# Patient Record
Sex: Male | Born: 1950 | Race: White | Hispanic: No | Marital: Married | Smoking: Never smoker
Health system: Southern US, Community
[De-identification: ages and names within clinical notes are randomized; demographics above are authoritative.]

## PROBLEM LIST (undated history)

## (undated) ENCOUNTER — Ambulatory Visit: Source: Home / Self Care

## (undated) DIAGNOSIS — F329 Major depressive disorder, single episode, unspecified: Secondary | ICD-10-CM

## (undated) DIAGNOSIS — S83249A Other tear of medial meniscus, current injury, unspecified knee, initial encounter: Secondary | ICD-10-CM

## (undated) DIAGNOSIS — F32A Depression, unspecified: Secondary | ICD-10-CM

## (undated) DIAGNOSIS — I10 Essential (primary) hypertension: Secondary | ICD-10-CM

## (undated) HISTORY — PX: EYE SURGERY: SHX253

## (undated) HISTORY — PX: HAND SURGERY: SHX662

---
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
## 1999-09-07 ENCOUNTER — Encounter: Payer: Self-pay | Admitting: Family Medicine

## 1999-09-07 ENCOUNTER — Ambulatory Visit (HOSPITAL_COMMUNITY): Admission: RE | Admit: 1999-09-07 | Discharge: 1999-09-07 | Payer: Self-pay | Admitting: Family Medicine

## 2007-06-13 ENCOUNTER — Inpatient Hospital Stay (HOSPITAL_COMMUNITY): Admission: EM | Admit: 2007-06-13 | Discharge: 2007-06-18 | Payer: Self-pay | Admitting: Emergency Medicine

## 2007-06-18 ENCOUNTER — Ambulatory Visit: Payer: Self-pay | Admitting: Physical Medicine & Rehabilitation

## 2007-12-10 ENCOUNTER — Ambulatory Visit (HOSPITAL_BASED_OUTPATIENT_CLINIC_OR_DEPARTMENT_OTHER): Admission: RE | Admit: 2007-12-10 | Discharge: 2007-12-11 | Payer: Self-pay | Admitting: *Deleted

## 2008-03-03 ENCOUNTER — Ambulatory Visit (HOSPITAL_BASED_OUTPATIENT_CLINIC_OR_DEPARTMENT_OTHER): Admission: RE | Admit: 2008-03-03 | Discharge: 2008-03-03 | Payer: Self-pay | Admitting: *Deleted

## 2009-05-06 ENCOUNTER — Emergency Department (HOSPITAL_COMMUNITY): Admission: EM | Admit: 2009-05-06 | Discharge: 2009-05-06 | Payer: Self-pay | Admitting: Family Medicine

## 2011-01-09 NOTE — H&P (Signed)
NAME:  Dwayne Cross, Dwayne Cross                ACCOUNT NO.:  [id number]   MEDICAL RECORD NO.:  [id number]          PATIENT TYPE:  INP   LOCATION:  1422                         FACILITY:  [facility]   PHYSICIAN:  Burnard Bunting, M.D.    DATE OF BIRTH:  11-21-1950   DATE OF ADMISSION:  06/12/2007  DATE OF DISCHARGE:                              HISTORY & PHYSICAL   CHIEF COMPLAINT:  Right hip pain.   HISTORY OF PRESENT ILLNESS:  The patient is a 60 year old patient with  right hip pain.  He is [employer] who fell at work  hard onto his right hip.  He has been unable to bear weight since that  time.  There was no loss of consciousness.  He denies any other  orthopedic complaints.  He denies any numbness or tingling in his legs.  He does report pain with any attempted movement of the right leg.   PAST MEDICAL HISTORY:  Notable for hypertension and depression.   PAST SURGICAL HISTORY:  Notable for eye surgery as a child.   MEDICATIONS:  1. Avalide.  2. Wellbutrin.   ALLERGIES:  No known drug allergies.   SOCIAL HISTORY:  The patient is married.  He does not smoke.  No alcohol  use.  He does work at [employer].   REVIEW OF SYSTEMS:  14 systems reviewed are noncontributory.  Specifically there is no history of DVT in the family, but his mother  does have osteoporosis.  Denies any other fever, chills, chest pain, or  shortness of breath.  There is also no diabetes.   PHYSICAL EXAMINATION:  VITAL SIGNS:  Blood pressure 143/93, pulse 76,  respirations 20.  CHEST:  Clear to auscultation.  HEART:  Regular rate and rhythm.  ABDOMEN:  Benign.  NECK:  Cervical spine has full range of motion.  EXTREMITIES:  He has intact bilateral upper extremity motion in wrists,  shoulders, and elbows with palpable radial pulse, intact sensation, and  no other masses, lymphadenopathy, or skin changes noted in the upper  extremity region.  Lower extremity demonstrates some pain with internal  and external rotation of right leg.  He has no grinding or crepitus in  the right knee or ankle with range of motion.  There is no effusion in  the knees.  He has good dorsiflexion, plantar flexion, strength, intact  sensation on the dorsoplantar aspect of the foot.  He has good quad and  hamstring strength as can best be assessed with the amount of pain that  he is in on his right hip.  Reflexes generally symmetric, no Morale  Leveaux lesion on the right hip area with no bruising or ecchymosis.   Radiographs demonstrated T-type acetabular fracture seen in both plain  radiographs and CT scan.   IMPRESSION:  T-type acetabular fracture with minimal displacement.   PLAN:  This may be an operative or nonoperative problem.  For now I am  going to admit him, put him on some foot pumps, low dose Coumadin.  I  will have one of my partners evaluate him tonight  for possible operative  intervention.      Burnard Bunting, M.D.  Electronically Signed     GSD/MEDQ  D:  06/13/2007  T:  06/13/2007  Job:  [id number]

## 2011-01-09 NOTE — Discharge Summary (Signed)
NAME:  Dwayne Cross, Dwayne Cross                ACCOUNT NO.:  [id number]   MEDICAL RECORD NO.:  [id number]          PATIENT TYPE:  INP   LOCATION:  1422                         FACILITY:  [facility]   PHYSICIAN:  Nadara Mustard, MD     DATE OF BIRTH:  September 02, 1950   DATE OF ADMISSION:  06/12/2007  DATE OF DISCHARGE:  06/18/2007                               DISCHARGE SUMMARY   DIAGNOSIS:  Right anterior wall acetabular fracture.   Discharged to skilled nursing in stable condition.   FOLLOW UP:  Dr. Lajoyce Corners in two weeks.   DISCHARGE MEDICATIONS:  Include his admission medications, which are:  1. Avelide 12.5/150 1/2 tablet daily.  2. Wellbutrin 300 mg 1 p.o. daily.  His p.r.n. medications, which include:  1. Tylox p.o. q.4-6h. p.r.n. pain.  2. Coumadin 1 mg 1 p.o. daily x1 month.  3. Restoril 30 mg 1 p.o. nightly p.r.n. sleep.   Physical therapy, progressive ambulation, nonweightbearing on the right.   Follow up with Dr. Lajoyce Corners in two weeks.   HISTORY OF PRESENT ILLNESS:  Patient is a 60 year old gentleman who fell  while working at [employer].  He fell directly on his right  hip.  Admission radiograph shows a nondisplaced anterior wall fracture  of the right hip.  Patient was started on ambulation.  Repeat  radiographs showed no further displacement of the anterior wall  fracture.  This was out of the weightbearing dome of the acetabulum and  was felt to be stable.  Patient was able to independently ambulate  without weightbearing.   At the time of discharge, the patient's hospital course was essentially  unremarkable.  He was seen by physical therapy and occupational therapy  and was able to independently  ambulate.  Patient was not felt to be  rehab candidate per physical therapy.  Patient requested skilled nursing  versus discharge to home, and a social worker and his insurance company  were working on discharge to skilled nursing.  Patient will require  occupational therapy as well as  physical therapy, strict  nonweightbearing on the right.  Follow up with Dr. Lajoyce Corners in two weeks.      Nadara Mustard, MD  Electronically Signed     MVD/MEDQ  D:  06/18/2007  T:  06/18/2007  Job:  [id number]

## 2011-01-09 NOTE — Op Note (Signed)
NAME:  Dwayne Cross, Dwayne Cross                ACCOUNT NO.:  [id number]   MEDICAL RECORD NO.:  [id number]          PATIENT TYPE:  AMB   LOCATION:  DSC                          FACILITY:  [facility]   PHYSICIAN:  Tennis Must Meyerdierks, M.D.DATE OF BIRTH:  [birth year]   DATE OF PROCEDURE:  03/03/2008  DATE OF DISCHARGE:                               OPERATIVE REPORT   PREOPERATIVE DIAGNOSIS:  Status post open reduction and internal  fixation with bone grafting, right scaphoid.   POSTOPERATIVE DIAGNOSIS:  Status post open reduction and internal  fixation with bone grafting, right scaphoid.   PROCEDURE:  Removal of pins, right scaphoid.   SURGEON:  Lowell Bouton, MD   ANESTHESIA:  General.   OPERATIVE FINDINGS:  The patient had three pins, one of which had come  out with the prep, as it was partially poking through the skin.  The  remaining two pins were much deeper.   PROCEDURE:  The scaphoid appeared to be healed under fluoroscopy.   PROCEDURE:  Under general anesthesia with a tourniquet on the right arm,  the right hand was prepped and draped in usual fashion.  After elevating  the limb, the tourniquet was inflated to 250 mmHg.  The pins were  localized under fluoroscopy and 1-cm incision was made over the thenar  eminence.  Blunt dissection was carried down through the subcutaneous  tissues and the pins were identified and removed with a needle holder.  X-rays showed that the fracture was healed under fluoroscopy.  The wound  was irrigated and closed with 4-0 nylon sutures.  Marcaine 0.5% was  placed in the skin edges for pain control.  Sterile dressings were  applied followed by a volar wrist splint.  The patient tolerated the  procedure well and went to recovery room awake and stable in good  condition.      Lowell Bouton, M.D.  Electronically Signed     EMM/MEDQ  D:  03/03/2008  T:  03/04/2008  Job:  [id number]

## 2011-01-09 NOTE — Op Note (Signed)
NAME:  Dwayne Cross, Dwayne Cross                ACCOUNT NO.:  [id number]   MEDICAL RECORD NO.:  [id number]          PATIENT TYPE:  AMB   LOCATION:  DSC                          FACILITY:  [facility]   PHYSICIAN:  Tennis Must Meyerdierks, M.D.DATE OF BIRTH:  10-Aug-1951   DATE OF PROCEDURE:  12/10/2007  DATE OF DISCHARGE:                               OPERATIVE REPORT   PREOPERATIVE DIAGNOSIS:  Scaphoid nonunion, right wrist.   POSTOPERATIVE DIAGNOSIS:  Scaphoid nonunion, right wrist.   PROCEDURE:  Open reduction internal fixation of right scaphoid, with  right iliac crest bone grafting.   SURGEON:  Lowell Bouton, M.D.   ANESTHESIA:  General.   OPERATIVE FINDINGS:  The patient had a scaphoid nonunion in the  midportion of the bone.  There was a lot of scar tissue between the 2  ends of the scaphoid.  There was punctate bleeding on both sides after  scraping out all the scar tissue.   PROCEDURE:  Under general anesthesia with a tourniquet on the right arm,  the right arm and right hip were prepped and draped in usual fashion,  and after exsanguinating the limb, the tourniquet was inflated to 250  mmHg.  A volar approach was made in line with the FCR tendon  longitudinally and carried obliquely across the thenar eminence.  A  sharp dissection was carried through the subcutaneous tissues and  bleeding points were coagulated.  Blunt dissection was carried down to  the radial artery, which traversed the field.  It was bluntly dissected  out and gently retracted with a vessel loop.  The FCR sheath was then  incised longitudinally and the tendon was retracted.  The floor of the  sheath was then incised longitudinally down to the joint.  The fracture  site was easily identifiable.  A Freer elevator was used to elevate the  radioscaphoid joint which appeared fairly smooth.  A 4-5 K-wire was  placed in the distal pole of the scaphoid and 1 in the proximal poles,  used as joysticks and the  fracture site was debrided with a rongeur and  curettes.  After completely debriding all the scar tissue, the gap was  about 1-cm.  A right iliac crest bone graft was then obtained through an  oblique incision over the top of the crest.  Dissection was carried  through the subcutaneous tissues with electrocautery.  Blunt dissection  was carried through the subcutaneous fat down to the top of the iliac  crest.  The periosteum was then incised with electrocautery and Key  elevator was used to elevate the muscle away from the crest.  A straight  osteotome was then used to take a wedge of bone from the top of the  iliac crest, that measured about a centimeter wide.  Curved osteotomes  were used to remove the deep part of the graft.  The graft was then  placed in a basin and a curette was used to take medullary graft.  The  hip wound was then packed open.  The graft was then tailored to the  defect with a rongeur and  placed in the gap between the 2 ends of the  scaphoid.  The joysticks assisted with correcting the DC deformity of  the lunate.  One joystick was removed from the proximal scaphoid and  replaced into the lunate bone.  A K-wire was then used to drill the ends  of the scaphoid so that there was good bleeding, and the medullary graft  was packed into the defects in the scaphoid nonunion.  The cortical  cancellous graft was then inserted and was tamped into place.  Three  .045 K-wires were then passed from distal to proximal across the graft  and into the proximal scaphoid.  The pins were cut below the skin at the  scaphoid tuberosity.  X-rays showed good position of the graft and under  fluoroscopy there was no impingement.  The graft was contoured with a  rongeur to make sure there was no impingement radially.  The graft  appeared to be stable.  There appeared to be excellent position of the  pins.  The iliac crest site was then irrigated copiously with saline.  Bone wax was used to  control the bleeding.  The fascia was closed with 2-  0 chromic suture and subcutaneous tissue was closed with 4-0 Vicryl.  The skin was closed with a 3-0 subcuticular Prolene.  Steri-Strips were  applied and 0.5% Marcaine was placed in the skin edges for the pain  control.  Sterile dressings were applied.  The wrist wound was closed  using 4-0 Vicryl in the capsular tissue deep and then 4-0 Vicryl in the  subcutaneous tissues with a 3-0 subcuticular Prolene in the skin.  Steri-  Strips were applied and 0.5% Marcaine was inserted for pain control.  The patient was then placed in a thumb spica splint.  He tolerated the  procedure well and went to the recovery room awake in stable and good  condition.      Lowell Bouton, M.D.  Electronically Signed     EMM/MEDQ  D:  12/10/2007  T:  12/11/2007  Job:  [id number]

## 2011-05-22 LAB — POCT I-STAT, CHEM 8
BUN: 26 — ABNORMAL HIGH
Calcium, Ion: 1.17
Chloride: 102
Creatinine, Ser: 1.2
Glucose, Bld: 82
Hemoglobin: 15.3
TCO2: 29

## 2011-05-24 LAB — POCT HEMOGLOBIN-HEMACUE: Hemoglobin: 15.7

## 2011-05-24 LAB — BASIC METABOLIC PANEL
Chloride: 98
GFR calc Af Amer: >60
Potassium: 3.5

## 2011-06-06 LAB — PROTIME-INR
INR: 1
Prothrombin Time: 13.7
Prothrombin Time: 13.8
Prothrombin Time: 14.6
Prothrombin Time: 15.9 — ABNORMAL HIGH

## 2018-09-15 DIAGNOSIS — L301 Dyshidrosis [pompholyx]: Secondary | ICD-10-CM | POA: Diagnosis not present

## 2018-09-15 DIAGNOSIS — I10 Essential (primary) hypertension: Secondary | ICD-10-CM | POA: Diagnosis not present

## 2018-09-15 DIAGNOSIS — Z23 Encounter for immunization: Secondary | ICD-10-CM | POA: Diagnosis not present

## 2018-09-15 DIAGNOSIS — F330 Major depressive disorder, recurrent, mild: Secondary | ICD-10-CM | POA: Diagnosis not present

## 2018-09-15 DIAGNOSIS — Z8042 Family history of malignant neoplasm of prostate: Secondary | ICD-10-CM | POA: Diagnosis not present

## 2018-09-15 DIAGNOSIS — N529 Male erectile dysfunction, unspecified: Secondary | ICD-10-CM | POA: Diagnosis not present

## 2018-09-15 DIAGNOSIS — Z0000 Encounter for general adult medical examination without abnormal findings: Secondary | ICD-10-CM | POA: Diagnosis not present

## 2018-09-15 DIAGNOSIS — Z125 Encounter for screening for malignant neoplasm of prostate: Secondary | ICD-10-CM | POA: Diagnosis not present

## 2018-09-15 DIAGNOSIS — J3081 Allergic rhinitis due to animal (cat) (dog) hair and dander: Secondary | ICD-10-CM | POA: Diagnosis not present

## 2018-09-15 DIAGNOSIS — J309 Allergic rhinitis, unspecified: Secondary | ICD-10-CM | POA: Diagnosis not present

## 2019-05-22 DIAGNOSIS — M25562 Pain in left knee: Secondary | ICD-10-CM | POA: Diagnosis not present

## 2019-05-25 ENCOUNTER — Other Ambulatory Visit: Payer: Self-pay | Admitting: Family Medicine

## 2019-05-25 DIAGNOSIS — M25562 Pain in left knee: Secondary | ICD-10-CM

## 2019-05-26 ENCOUNTER — Other Ambulatory Visit: Payer: Self-pay

## 2019-05-26 ENCOUNTER — Ambulatory Visit
Admission: RE | Admit: 2019-05-26 | Discharge: 2019-05-26 | Disposition: A | Discharge status: Home / Self Care | Payer: Medicare Other | Source: Ambulatory Visit | Attending: Family Medicine | Admitting: Family Medicine

## 2019-05-26 DIAGNOSIS — M7122 Synovial cyst of popliteal space [Baker], left knee: Secondary | ICD-10-CM | POA: Diagnosis not present

## 2019-05-26 DIAGNOSIS — S82232A Displaced oblique fracture of shaft of left tibia, initial encounter for closed fracture: Secondary | ICD-10-CM | POA: Diagnosis not present

## 2019-05-26 DIAGNOSIS — M25462 Effusion, left knee: Secondary | ICD-10-CM | POA: Diagnosis not present

## 2019-05-26 DIAGNOSIS — M25562 Pain in left knee: Secondary | ICD-10-CM

## 2019-05-28 ENCOUNTER — Encounter: Payer: Self-pay | Admitting: Orthopedic Surgery

## 2019-05-28 ENCOUNTER — Ambulatory Visit (INDEPENDENT_AMBULATORY_CARE_PROVIDER_SITE_OTHER): Payer: Medicare Other | Admitting: Orthopedic Surgery

## 2019-05-28 VITALS — Ht 69.0 in | Wt 193.0 lb

## 2019-05-28 DIAGNOSIS — S83242A Other tear of medial meniscus, current injury, left knee, initial encounter: Secondary | ICD-10-CM

## 2019-06-02 ENCOUNTER — Encounter: Payer: Self-pay | Admitting: Orthopedic Surgery

## 2019-06-02 NOTE — Progress Notes (Signed)
Office Visit Note   Patient: Dwayne Cross           Date of Birth: 13-Dec-1950           MRN: [id number] Visit Date: 05/28/2019              Requested by: Maury Dus, MD [place],  [place] [address] PCP: Maury Dus, MD  Chief Complaint  Patient presents with  . Left Knee - Pain, New Patient (Initial Visit)      HPI: Patient is a 68 year old gentleman who presents with acute left knee pain.  He states that he fell twice once in April and again in September and has had severe pain within the last month.  Patient is status post MRI scan of his left knee.  He is currently taking meloxicam.  He states the pain is worse with walking downhill.  Patient is not a smoker.  Assessment & Plan: Visit Diagnoses:  1. Acute medial meniscus tear of left knee, initial encounter     Plan: Discussed with the patient recommendation to proceed with arthroscopic debridement of the meniscal tear.  Risks and benefits were discussed including persistent pain if there is significant articular cartilage damage.  Discussed that arthroscopy would resolve the pain from the meniscal tear but most likely would not resolve pain from the arthritic changes.  Further risk including infection were discussed.  Patient states he understands he states he would like to proceed with surgery in November.  Follow-Up Instructions: Return in about 1 week (around 06/04/2019) for Patient would like to proceed with arthroscopic debridement of his knee in November.   Ortho Exam  Patient is alert, oriented, no adenopathy, well-dressed, normal affect, normal respiratory effort. Examination patient has an antalgic gait hip collaterals and cruciates are stable there is no effusion he is tender to palpation over the medial and lateral joint line.  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  Imaging: No results found. No images are attached to  the encounter.  Labs: No results found for: HGBA1C, ESRSEDRATE, CRP, LABURIC, REPTSTATUS, GRAMSTAIN, CULT, LABORGA   No results found for: ALBUMIN, PREALBUMIN, LABURIC  No results found for: MG No results found for: VD25OH  No results found for: PREALBUMIN CBC EXTENDED 03/03/2008 12/10/2007  HGB 15.7 POINT OF CARE RESULT 15.3  HCT - 45.0     Body mass index is 28.5 kg/m.  Orders:  No orders of the defined types were placed in this encounter.  No orders of the defined types were placed in this encounter.    Procedures: No procedures performed  Clinical Data: No additional findings.  ROS:  All other systems negative, except as noted in the HPI. Review of Systems  Objective: Vital Signs: Ht 5\' 9"  (1.753 m)   Wt 193 lb (87.5 kg)   BMI 28.50 kg/m   Specialty Comments:  No specialty comments available.  PMFS History: There are no active problems to display for this patient.  History reviewed. No pertinent past medical history.  History reviewed. No pertinent family history.  History reviewed. No pertinent surgical history. Social History   Occupational History  . Not on file  Tobacco Use  . Smoking status: Never Smoker  . Smokeless tobacco: Never Used  Substance and Sexual Activity  . Alcohol use: Never    Frequency: Never  . Drug use: Never  . Sexual activity: Not on file

## 2019-06-17 ENCOUNTER — Telehealth: Payer: Self-pay | Admitting: Orthopedic Surgery

## 2019-06-17 NOTE — Telephone Encounter (Signed)
Patient called. Has questions about his recovery after surgery. His call back number is [phone]

## 2019-06-17 NOTE — Telephone Encounter (Signed)
Erin please advise, thank you.  

## 2019-07-01 ENCOUNTER — Telehealth: Payer: Self-pay | Admitting: Orthopedic Surgery

## 2019-07-01 NOTE — Telephone Encounter (Signed)
Pt walked in office to change surgery date from 07/07/19, he states that he would like to push it back to the end of the year.

## 2019-07-03 ENCOUNTER — Other Ambulatory Visit (HOSPITAL_COMMUNITY): Admission: RE | Admit: 2019-07-03 | Payer: PRIVATE HEALTH INSURANCE | Source: Ambulatory Visit

## 2019-07-08 ENCOUNTER — Other Ambulatory Visit: Payer: Self-pay

## 2019-07-08 ENCOUNTER — Encounter (HOSPITAL_BASED_OUTPATIENT_CLINIC_OR_DEPARTMENT_OTHER): Payer: Self-pay | Admitting: *Deleted

## 2019-07-09 ENCOUNTER — Other Ambulatory Visit: Payer: Self-pay | Admitting: Physician Assistant

## 2019-07-10 ENCOUNTER — Other Ambulatory Visit: Payer: Self-pay

## 2019-07-10 ENCOUNTER — Other Ambulatory Visit (HOSPITAL_COMMUNITY)
Admission: RE | Admit: 2019-07-10 | Discharge: 2019-07-10 | Disposition: A | Discharge status: Home / Self Care | Payer: Medicare Other | Source: Ambulatory Visit | Attending: Orthopedic Surgery | Admitting: Orthopedic Surgery

## 2019-07-10 ENCOUNTER — Encounter (HOSPITAL_BASED_OUTPATIENT_CLINIC_OR_DEPARTMENT_OTHER)
Admission: RE | Admit: 2019-07-10 | Discharge: 2019-07-10 | Disposition: A | Discharge status: Home / Self Care | Payer: Medicare Other | Source: Ambulatory Visit | Attending: Orthopedic Surgery | Admitting: Orthopedic Surgery

## 2019-07-10 DIAGNOSIS — S83242A Other tear of medial meniscus, current injury, left knee, initial encounter: Secondary | ICD-10-CM | POA: Insufficient documentation

## 2019-07-10 DIAGNOSIS — Z20828 Contact with and (suspected) exposure to other viral communicable diseases: Secondary | ICD-10-CM | POA: Insufficient documentation

## 2019-07-10 DIAGNOSIS — Z01818 Encounter for other preprocedural examination: Secondary | ICD-10-CM | POA: Diagnosis not present

## 2019-07-10 DIAGNOSIS — Z01812 Encounter for preprocedural laboratory examination: Secondary | ICD-10-CM | POA: Diagnosis present

## 2019-07-10 LAB — BASIC METABOLIC PANEL
Anion gap: 10 (ref 5–15)
BUN: 22 mg/dL (ref 8–23)
CO2: 26 mmol/L (ref 22–32)
Calcium: 9.1 mg/dL (ref 8.9–10.3)
Chloride: 103 mmol/L (ref 98–111)
Creatinine, Ser: 1.22 mg/dL (ref 0.61–1.24)
GFR calc Af Amer: >60 mL/min (ref >60)
GFR calc non Af Amer: >60 mL/min (ref >60)
Glucose, Bld: 90 mg/dL (ref 70–99)
Potassium: 3.9 mmol/L (ref 3.5–5.1)
Sodium: 139 mmol/L (ref 135–145)

## 2019-07-10 NOTE — Progress Notes (Signed)

## 2019-07-12 LAB — NOVEL CORONAVIRUS, NAA (HOSP ORDER, SEND-OUT TO REF LAB; TAT 18-24 HRS): SARS-CoV-2, NAA: NOT DETECTED

## 2019-07-13 NOTE — Anesthesia Preprocedure Evaluation (Signed)
Anesthesia Evaluation  Patient identified by MRN, date of birth, ID band Patient awake    Reviewed: Allergy & Precautions, H&P , NPO status , Patient's Chart, lab work & pertinent test results, reviewed documented beta blocker date and time   Airway Mallampati: II  TM Distance: >3 FB Neck ROM: full    Dental no notable dental hx.    Pulmonary neg pulmonary ROS,    Pulmonary exam normal breath sounds clear to auscultation       Cardiovascular Exercise Tolerance: Good hypertension, negative cardio ROS   Rhythm:regular Rate:Normal     Neuro/Psych negative neurological ROS  negative psych ROS   GI/Hepatic negative GI ROS, Neg liver ROS,   Endo/Other  negative endocrine ROS  Renal/GU negative Renal ROS  negative genitourinary   Musculoskeletal   Abdominal   Peds  Hematology negative hematology ROS (+)   Anesthesia Other Findings   Reproductive/Obstetrics negative OB ROS                             Anesthesia Physical Anesthesia Plan  ASA: II  Anesthesia Plan: General   Post-op Pain Management:    Induction: Intravenous  PONV Risk Score and Plan: 2 and Ondansetron, Treatment may vary due to age or medical condition and Dexamethasone  Airway Management Planned: Oral ETT and LMA  Additional Equipment:   Intra-op Plan:   Post-operative Plan: Extubation in OR  Informed Consent: I have reviewed the patients History and Physical, chart, labs and discussed the procedure including the risks, benefits and alternatives for the proposed anesthesia with the patient or authorized representative who has indicated his/her understanding and acceptance.     Dental Advisory Given  Plan Discussed with: CRNA, Anesthesiologist and Surgeon  Anesthesia Plan Comments: (  )        Anesthesia Quick Evaluation

## 2019-07-14 ENCOUNTER — Other Ambulatory Visit: Payer: Self-pay

## 2019-07-14 ENCOUNTER — Ambulatory Visit (HOSPITAL_BASED_OUTPATIENT_CLINIC_OR_DEPARTMENT_OTHER): Payer: Medicare Other | Admitting: Anesthesiology

## 2019-07-14 ENCOUNTER — Ambulatory Visit (HOSPITAL_BASED_OUTPATIENT_CLINIC_OR_DEPARTMENT_OTHER)
Admission: RE | Admit: 2019-07-14 | Discharge: 2019-07-14 | Disposition: A | Discharge status: Home / Self Care | Payer: Medicare Other | Attending: Orthopedic Surgery | Admitting: Orthopedic Surgery

## 2019-07-14 ENCOUNTER — Encounter (HOSPITAL_BASED_OUTPATIENT_CLINIC_OR_DEPARTMENT_OTHER)
Admission: RE | Disposition: A | Discharge status: Home / Self Care | Payer: Self-pay | Source: Home / Self Care | Attending: Orthopedic Surgery

## 2019-07-14 ENCOUNTER — Encounter (HOSPITAL_BASED_OUTPATIENT_CLINIC_OR_DEPARTMENT_OTHER): Payer: Self-pay

## 2019-07-14 DIAGNOSIS — Z885 Allergy status to narcotic agent status: Secondary | ICD-10-CM | POA: Diagnosis not present

## 2019-07-14 DIAGNOSIS — M23232 Derangement of other medial meniscus due to old tear or injury, left knee: Secondary | ICD-10-CM

## 2019-07-14 DIAGNOSIS — I10 Essential (primary) hypertension: Secondary | ICD-10-CM | POA: Insufficient documentation

## 2019-07-14 DIAGNOSIS — S83242A Other tear of medial meniscus, current injury, left knee, initial encounter: Secondary | ICD-10-CM | POA: Insufficient documentation

## 2019-07-14 DIAGNOSIS — W19XXXA Unspecified fall, initial encounter: Secondary | ICD-10-CM | POA: Insufficient documentation

## 2019-07-14 DIAGNOSIS — F329 Major depressive disorder, single episode, unspecified: Secondary | ICD-10-CM | POA: Insufficient documentation

## 2019-07-14 DIAGNOSIS — Z79899 Other long term (current) drug therapy: Secondary | ICD-10-CM | POA: Diagnosis not present

## 2019-07-14 HISTORY — DX: Other tear of medial meniscus, current injury, unspecified knee, initial encounter: S83.249A

## 2019-07-14 HISTORY — DX: Depression, unspecified: F32.A

## 2019-07-14 HISTORY — PX: KNEE ARTHROSCOPY WITH MEDIAL MENISECTOMY: SHX5651

## 2019-07-14 HISTORY — DX: Essential (primary) hypertension: I10

## 2019-07-14 SURGERY — ARTHROSCOPY, KNEE, WITH MEDIAL MENISCECTOMY
Anesthesia: General | Site: Knee | Laterality: Left

## 2019-07-14 MED ORDER — OXYCODONE-ACETAMINOPHEN 5-325 MG PO TABS: 1.0000 | ORAL_TABLET | ORAL | 0 refills | Status: DC | PRN

## 2019-07-14 MED ORDER — CHLORHEXIDINE GLUCONATE 4 % EX LIQD: 60.0000 mL | Freq: Once | CUTANEOUS | Status: DC

## 2019-07-14 MED ORDER — OXYCODONE HCL 5 MG PO TABS: 5.0000 mg | ORAL_TABLET | Freq: Once | ORAL | Status: DC | PRN

## 2019-07-14 MED ORDER — HYDRALAZINE HCL 20 MG/ML IJ SOLN
10.0000 mg | Freq: Once | INTRAMUSCULAR | Status: AC
  Administered 2019-07-14: 10 mg via INTRAVENOUS

## 2019-07-14 MED ORDER — ACETAMINOPHEN 160 MG/5ML PO SOLN: 325.0000 mg | ORAL | Status: DC | PRN

## 2019-07-14 MED ORDER — LACTATED RINGERS IV SOLN
INTRAVENOUS | Status: DC
  Administered 2019-07-14 (×2): via INTRAVENOUS

## 2019-07-14 MED ORDER — MEPERIDINE HCL 25 MG/ML IJ SOLN: 6.2500 mg | INTRAMUSCULAR | Status: DC | PRN

## 2019-07-14 MED ORDER — LIDOCAINE HCL (CARDIAC) PF 100 MG/5ML IV SOSY
PREFILLED_SYRINGE | INTRAVENOUS | Status: DC | PRN
  Administered 2019-07-14: 50 mg via INTRAVENOUS

## 2019-07-14 MED ORDER — PROPOFOL 10 MG/ML IV BOLUS
INTRAVENOUS | Status: AC
  Filled 2019-07-14: qty 20

## 2019-07-14 MED ORDER — MIDAZOLAM HCL 2 MG/2ML IJ SOLN
INTRAMUSCULAR | Status: AC
  Filled 2019-07-14: qty 2

## 2019-07-14 MED ORDER — ONDANSETRON HCL 4 MG/2ML IJ SOLN: 4.0000 mg | Freq: Once | INTRAMUSCULAR | Status: DC | PRN

## 2019-07-14 MED ORDER — DEXAMETHASONE SODIUM PHOSPHATE 4 MG/ML IJ SOLN
INTRAMUSCULAR | Status: DC | PRN
  Administered 2019-07-14 (×2): 4 mg via INTRAVENOUS

## 2019-07-14 MED ORDER — MIDAZOLAM HCL 2 MG/2ML IJ SOLN
1.0000 mg | INTRAMUSCULAR | Status: DC | PRN
  Administered 2019-07-14: 07:00:00 2 mg via INTRAVENOUS

## 2019-07-14 MED ORDER — ONDANSETRON HCL 4 MG/2ML IJ SOLN
INTRAMUSCULAR | Status: DC | PRN
  Administered 2019-07-14: 4 mg via INTRAVENOUS

## 2019-07-14 MED ORDER — PROPOFOL 10 MG/ML IV BOLUS
INTRAVENOUS | Status: DC | PRN
  Administered 2019-07-14: 200 mg via INTRAVENOUS

## 2019-07-14 MED ORDER — FENTANYL CITRATE (PF) 100 MCG/2ML IJ SOLN
50.0000 ug | INTRAMUSCULAR | Status: DC | PRN
  Administered 2019-07-14: 08:00:00 50 ug via INTRAVENOUS

## 2019-07-14 MED ORDER — CEFAZOLIN SODIUM-DEXTROSE 2-4 GM/100ML-% IV SOLN
2.0000 g | INTRAVENOUS | Status: AC
  Administered 2019-07-14: 2 g via INTRAVENOUS

## 2019-07-14 MED ORDER — OXYCODONE HCL 5 MG/5ML PO SOLN: 5.0000 mg | Freq: Once | ORAL | Status: DC | PRN

## 2019-07-14 MED ORDER — FENTANYL CITRATE (PF) 100 MCG/2ML IJ SOLN
25.0000 ug | INTRAMUSCULAR | Status: DC | PRN
  Administered 2019-07-14: 50 ug via INTRAVENOUS

## 2019-07-14 MED ORDER — KETOROLAC TROMETHAMINE 30 MG/ML IJ SOLN
INTRAMUSCULAR | Status: DC | PRN
  Administered 2019-07-14: 30 mg via INTRAVENOUS

## 2019-07-14 MED ORDER — FENTANYL CITRATE (PF) 100 MCG/2ML IJ SOLN
INTRAMUSCULAR | Status: AC
  Filled 2019-07-14: qty 2

## 2019-07-14 MED ORDER — BUPIVACAINE HCL (PF) 0.5 % IJ SOLN
INTRAMUSCULAR | Status: DC | PRN
  Administered 2019-07-14: 20 mL

## 2019-07-14 MED ORDER — SODIUM CHLORIDE 0.9 % IR SOLN
Status: DC | PRN
  Administered 2019-07-14: 6000 mL

## 2019-07-14 MED ORDER — CEFAZOLIN SODIUM-DEXTROSE 2-4 GM/100ML-% IV SOLN
INTRAVENOUS | Status: AC
  Filled 2019-07-14: qty 100

## 2019-07-14 MED ORDER — HYDRALAZINE HCL 20 MG/ML IJ SOLN
INTRAMUSCULAR | Status: AC
  Filled 2019-07-14: qty 1

## 2019-07-14 MED ORDER — ACETAMINOPHEN 325 MG PO TABS: 325.0000 mg | ORAL_TABLET | ORAL | Status: DC | PRN

## 2019-07-14 SURGICAL SUPPLY — 27 items
BNDG COHESIVE 6X5 TAN STRL LF (GAUZE/BANDAGES/DRESSINGS) ×2 IMPLANT
COVER WAND RF STERILE (DRAPES) IMPLANT
DISSECTOR 4.0MM X 13CM (MISCELLANEOUS) IMPLANT
DRAPE ARTHROSCOPY W/POUCH 90 (DRAPES) ×2 IMPLANT
DRAPE U-SHAPE 47X51 STRL (DRAPES) ×2 IMPLANT
DRSG EMULSION OIL 3X3 NADH (GAUZE/BANDAGES/DRESSINGS) ×2 IMPLANT
DURAPREP 26ML APPLICATOR (WOUND CARE) ×2 IMPLANT
EXCALIBUR 3.8MM X 13CM (MISCELLANEOUS) ×2 IMPLANT
GAUZE SPONGE 4X4 12PLY STRL (GAUZE/BANDAGES/DRESSINGS) ×2 IMPLANT
GLOVE BIOGEL PI IND STRL 9 (GLOVE) ×1 IMPLANT
GLOVE BIOGEL PI INDICATOR 9 (GLOVE) ×1
GLOVE SURG ORTHO 9.0 STRL STRW (GLOVE) ×2 IMPLANT
GOWN STRL REUS W/ TWL LRG LVL3 (GOWN DISPOSABLE) ×1 IMPLANT
GOWN STRL REUS W/ TWL XL LVL3 (GOWN DISPOSABLE) ×1 IMPLANT
GOWN STRL REUS W/TWL LRG LVL3 (GOWN DISPOSABLE) ×2
GOWN STRL REUS W/TWL XL LVL3 (GOWN DISPOSABLE) ×2
KNEE WRAP E Z 3 GEL PACK (MISCELLANEOUS) ×1 IMPLANT
MANIFOLD NEPTUNE II (INSTRUMENTS) ×1 IMPLANT
NDL SAFETY ECLIPSE 18X1.5 (NEEDLE) ×1 IMPLANT
NEEDLE HYPO 18GX1.5 SHARP (NEEDLE) ×2
PACK ARTHROSCOPY DSU (CUSTOM PROCEDURE TRAY) ×2 IMPLANT
PACK BASIN DAY SURGERY FS (CUSTOM PROCEDURE TRAY) ×2 IMPLANT
PORT APPOLLO RF 90DEGREE MULTI (SURGICAL WAND) ×2 IMPLANT
PROBE APOLLO 90XL (SURGICAL WAND) IMPLANT
SUT ETHILON 2 0 FSLX (SUTURE) ×2 IMPLANT
TOWEL GREEN STERILE FF (TOWEL DISPOSABLE) ×2 IMPLANT
TUBING ARTHROSCOPY IRRIG 16FT (MISCELLANEOUS) ×2 IMPLANT

## 2019-07-14 NOTE — Anesthesia Postprocedure Evaluation (Signed)
Anesthesia Post Note  Patient: BRENNDEN MASTEN  Procedure(s) Performed: LEFT KNEE ARTHROSCOPY AND DEBRIDEMENT (Left Knee)     Patient location during evaluation: PACU Anesthesia Type: General Level of consciousness: awake and alert Pain management: pain level controlled Vital Signs Assessment: post-procedure vital signs reviewed and stable Respiratory status: spontaneous breathing, nonlabored ventilation, respiratory function stable and patient connected to nasal cannula oxygen Cardiovascular status: blood pressure returned to baseline and stable Postop Assessment: no apparent nausea or vomiting Anesthetic complications: no    Last Vitals:  Vitals:   07/14/19 0930 07/14/19 0956  BP: 126/82 (!) 143/88  Pulse: 72 84  Resp: 19 17  Temp:  36.5 C  SpO2: 100% 100%    Last Pain:  Vitals:   07/14/19 0956  TempSrc:   PainSc: 3                  Tashawna Thom

## 2019-07-14 NOTE — Op Note (Signed)
07/14/2019  8:10 AM  PATIENT:  Dwayne Cross    PRE-OPERATIVE DIAGNOSIS:  Medial Meniscal Tear Left Knee  POST-OPERATIVE DIAGNOSIS:  Same  PROCEDURE:  LEFT KNEE ARTHROSCOPY AND DEBRIDEMENT With partial resection medial meniscus.  SURGEON:  Newt Minion, MD  PHYSICIAN ASSISTANT:None ANESTHESIA:   General  PREOPERATIVE INDICATIONS:  Dwayne Cross is a  68 y.o. male with a diagnosis of Medial Meniscal Tear Left Knee who failed conservative measures and elected for surgical management.    The risks benefits and alternatives were discussed with the patient preoperatively including but not limited to the risks of infection, bleeding, nerve injury, cardiopulmonary complications, the need for revision surgery, among others, and the patient was willing to proceed.  OPERATIVE IMPLANTS: None  @ENCIMAGES @  OPERATIVE FINDINGS: Degenerative posterior medial meniscal tear  OPERATIVE PROCEDURE: Patient was brought the operating room underwent general anesthetic.  After adequate levels anesthesia were obtained patient's left lower extremity was prepped using DuraPrep draped into a sterile field a timeout was called.  The scope was inserted through the anterior lateral portal anterior medial working portal was established.  Visualization of the patient has significant amount of synovitis this was resected with the shaver.  The electrical wand was used for hemostasis.  With valgus stress the patient had a large degenerative tear of the posterior horn of the medial meniscus.  This was debrided back to stable margins with the shaver.  Examination the notch showed an intact ACL.  Examination lateral joint line showed no articular cartilage or meniscal pathology.  Patient did have some partial-thickness tearing of the medial femoral condyle this was debrided with a shaver.  The knee extended patient had synovitis in the patellofemoral joint which was also debrided with a shaver and electrocautery was used  for hemostasis.  Survey of all compartments afterward showed to be no loose bodies.  Instruments were removed the portals were closed using 2-0 nylon the joint was injected with 20 cc quarter percent Marcaine plain and 4 mg of morphine.  A sterile dressing was applied patient was extubated taken the PACU in stable condition.   DISCHARGE PLANNING:  Antibiotic duration: Preoperative antibiotics Kefzol  Weightbearing: Weightbearing as tolerated  Pain medication: Prescription for Percocet  Dressing care/ Wound VAC: Discontinue dressing in 2 days  Ambulatory devices: Crutches  Discharge to: home  Follow-up: In the office 1 week post operative.

## 2019-07-14 NOTE — Anesthesia Procedure Notes (Signed)
Procedure Name: LMA Insertion Date/Time: 07/14/2019 7:32 AM Performed by: Oletta Lamas, CRNA Pre-anesthesia Checklist: Patient identified, Emergency Drugs available, Suction available and Patient being monitored Patient Re-evaluated:Patient Re-evaluated prior to induction Oxygen Delivery Method: Circle System Utilized Preoxygenation: Pre-oxygenation with 100% oxygen Induction Type: IV induction Ventilation: Mask ventilation without difficulty LMA: LMA with gastric port inserted LMA Size: 4.0 Number of attempts: 1 Placement Confirmation: positive ETCO2 Tube secured with: Tape Dental Injury: Teeth and Oropharynx as per pre-operative assessment

## 2019-07-14 NOTE — Transfer of Care (Signed)
Immediate Anesthesia Transfer of Care Note  Patient: Dwayne Cross  Procedure(s) Performed: LEFT KNEE ARTHROSCOPY AND DEBRIDEMENT (Left Knee)  Patient Location: PACU  Anesthesia Type:General  Level of Consciousness: drowsy, patient cooperative and responds to stimulation  Airway & Oxygen Therapy: Patient Spontanous Breathing and Patient connected to face mask oxygen  Post-op Assessment: Report given to RN and Post -op Vital signs reviewed and stable  Post vital signs: Reviewed and stable  Last Vitals:  Vitals Value Taken Time  BP 120/83 07/14/19 0810  Temp    Pulse 59 07/14/19 0812  Resp 10 07/14/19 0812  SpO2 92 % 07/14/19 0812  Vitals shown include unvalidated device data.  Last Pain:  Vitals:   07/14/19 8110  TempSrc: Oral  PainSc: 0-No pain      Patients Stated Pain Goal: 5 (31/59/45 8592)  Complications: No apparent anesthesia complications

## 2019-07-14 NOTE — Discharge Instructions (Signed)
No Ibuprofen until 4:15 PM on 07/14/2019.    Post Anesthesia Home Care Instructions  Activity: Get plenty of rest for the remainder of the day. A responsible individual must stay with you for 24 hours following the procedure.  For the next 24 hours, DO NOT: -Drive a car -Paediatric nurse -Drink alcoholic beverages -Take any medication unless instructed by your physician -Make any legal decisions or sign important papers.  Meals: Start with liquid foods such as gelatin or soup. Progress to regular foods as tolerated. Avoid greasy, spicy, heavy foods. If nausea and/or vomiting occur, drink only clear liquids until the nausea and/or vomiting subsides. Call your physician if vomiting continues.  Special Instructions/Symptoms: Your throat may feel dry or sore from the anesthesia or the breathing tube placed in your throat during surgery. If this causes discomfort, gargle with warm salt water. The discomfort should disappear within 24 hours.  If you had a scopolamine patch placed behind your ear for the management of post- operative nausea and/or vomiting:  1. The medication in the patch is effective for 72 hours, after which it should be removed.  Wrap patch in a tissue and discard in the trash. Wash hands thoroughly with soap and water. 2. You may remove the patch earlier than 72 hours if you experience unpleasant side effects which may include dry mouth, dizziness or visual disturbances. 3. Avoid touching the patch. Wash your hands with soap and water after contact with the patch.

## 2019-07-14 NOTE — H&P (Signed)
Dwayne Cross is an 68 y.o. male.   Chief Complaint: Mechanical catching and locking left knee HPI: Patient is a 68 year old gentleman who presents with acute left knee pain.  He states that he fell twice once in April and again in September and has had severe pain within the last month.  Patient is status post MRI scan of his left knee.  He is currently taking meloxicam.  He states the pain is worse with walking downhill.  Patient is not a smoker.  Past Medical History:  Diagnosis Date  . Depression   . Hypertension   . MMT (medial meniscus tear)    left knee    Past Surgical History:  Procedure Laterality Date  . EYE SURGERY     as child  . HAND SURGERY Right     History reviewed. No pertinent family history. Social History:  reports that he has never smoked. He has never used smokeless tobacco. He reports that he does not drink alcohol or use drugs.  Allergies:  Allergies  Allergen Reactions  . Morphine And Related Other (See Comments)    Anxious, "figity"    Medications Prior to Admission  Medication Sig Dispense Refill  . buPROPion (WELLBUTRIN XL) 300 MG 24 hr tablet Take 300 mg by mouth daily.    . fluticasone (FLONASE) 50 MCG/ACT nasal spray Place into both nostrils daily.    . valsartan-hydrochlorothiazide (DIOVAN-HCT) 160-12.5 MG tablet Take 1 tablet by mouth daily.      No results found for this or any previous visit (from the past 48 hour(s)). No results found.  Review of Systems  All other systems reviewed and are negative.   Blood pressure 138/90, pulse 62, temperature 98.5 F (36.9 C), temperature source Oral, resp. rate 18, height 5\' 9"  (1.753 m), weight 86.6 kg, SpO2 100 %. Physical Exam  Patient is alert, oriented, no adenopathy, well-dressed, normal affect, normal respiratory effort. Examination patient has an antalgic gait hip collaterals and cruciates are stable there is no effusion he is tender to palpation over the medial and lateral joint  line.  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 Assessment/Plan 1. Acute medial meniscus tear of left knee, initial encounter     Plan: Discussed with the patient recommendation to proceed with arthroscopic debridement of the meniscal tear.  Risks and benefits were discussed including persistent pain if there is significant articular cartilage damage.  Discussed that arthroscopy would resolve the pain from the meniscal tear but most likely would not resolve pain from the arthritic changes.  Further risk including infection were discussed.  Patient states he understands he states he would like to proceed with surgery in November.   Newt Minion, MD 07/14/2019, 7:15 AM

## 2019-07-15 ENCOUNTER — Encounter (HOSPITAL_BASED_OUTPATIENT_CLINIC_OR_DEPARTMENT_OTHER): Payer: Self-pay | Admitting: Orthopedic Surgery

## 2019-07-15 NOTE — Addendum Note (Signed)
Addendum  created 07/15/19 1448 by Tawni Millers, CRNA   Charge Capture section accepted

## 2019-07-20 ENCOUNTER — Ambulatory Visit (INDEPENDENT_AMBULATORY_CARE_PROVIDER_SITE_OTHER): Payer: Medicare Other | Admitting: Orthopedic Surgery

## 2019-07-20 ENCOUNTER — Encounter: Payer: Self-pay | Admitting: Orthopedic Surgery

## 2019-07-20 ENCOUNTER — Other Ambulatory Visit: Payer: Self-pay

## 2019-07-20 VITALS — Ht 69.0 in | Wt 190.9 lb

## 2019-07-20 DIAGNOSIS — M23232 Derangement of other medial meniscus due to old tear or injury, left knee: Secondary | ICD-10-CM

## 2019-07-20 NOTE — Progress Notes (Signed)
   Office Visit Note   Patient: Dwayne Cross           Date of Birth: 04/13/1951           MRN: [id number] Visit Date: 07/20/2019              Requested by: Maury Dus, MD [address],  [place] [address] PCP: Maury Dus, MD  Chief Complaint  Patient presents with  . Left Knee - Routine Post Op    07/14/2019 left knee arthro & deb      HPI: Patient presents today 6 days status post left knee arthroscopy and debridement he is doing well and has had no pain  Assessment & Plan: Visit Diagnoses: No diagnosis found.  Plan: He will work on [employer].  He will follow up in 1 week for removal of sutures  Follow-Up Instructions: No follow-ups on file.   Ortho Exam  Patient is alert, oriented, no adenopathy, well-dressed, normal affect, normal respiratory effort. Left knee healing surgical portals minimal soft tissue swelling compartments are soft and nontender no effusion good range of motion nontender around joint line no cellulitis  Imaging: No results found. No images are attached to the encounter.  Labs: No results found for: HGBA1C, ESRSEDRATE, CRP, LABURIC, REPTSTATUS, GRAMSTAIN, CULT, LABORGA   No results found for: ALBUMIN, PREALBUMIN, LABURIC  No results found for: MG No results found for: VD25OH  No results found for: PREALBUMIN CBC EXTENDED 03/03/2008 12/10/2007  HGB 15.7 POINT OF CARE RESULT 15.3  HCT - 45.0     Body mass index is 28.19 kg/m.  Orders:  No orders of the defined types were placed in this encounter.  No orders of the defined types were placed in this encounter.    Procedures: No procedures performed  Clinical Data: No additional findings.  ROS:  All other systems negative, except as noted in the HPI. Review of Systems  Objective: Vital Signs: Ht 5\' 9"  (1.753 m)   Wt 190 lb 14.7 oz (86.6 kg)   BMI 28.19 kg/m   Specialty Comments:  No specialty comments available.  PMFS History:  Patient Active Problem List   Diagnosis Date Noted  . 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 Depression   . Hypertension   . MMT (medial meniscus tear)    left knee    No family history on file.  Past Surgical History:  Procedure Laterality Date  . EYE SURGERY     as child  . HAND SURGERY Right   . KNEE ARTHROSCOPY WITH MEDIAL MENISECTOMY Left 07/14/2019   Procedure: LEFT KNEE ARTHROSCOPY AND DEBRIDEMENT;  Surgeon: Newt Minion, MD;  Location: [facility];  Service: Orthopedics;  Laterality: Left;   Social History   Occupational History  . Not on file  Tobacco Use  . Smoking status: Never Smoker  . Smokeless tobacco: Never Used  Substance and Sexual Activity  . Alcohol use: Never    Frequency: Never  . Drug use: Never  . Sexual activity: Not on file

## 2019-07-27 ENCOUNTER — Ambulatory Visit (INDEPENDENT_AMBULATORY_CARE_PROVIDER_SITE_OTHER): Payer: Medicare Other | Admitting: Physician Assistant

## 2019-07-27 ENCOUNTER — Encounter: Payer: Self-pay | Admitting: Physician Assistant

## 2019-07-27 ENCOUNTER — Other Ambulatory Visit: Payer: Self-pay

## 2019-07-27 VITALS — Ht 69.0 in | Wt 190.0 lb

## 2019-07-27 DIAGNOSIS — M23232 Derangement of other medial meniscus due to old tear or injury, left knee: Secondary | ICD-10-CM

## 2019-07-27 NOTE — Progress Notes (Signed)
   Office Visit Note   Patient: Dwayne Cross           Date of Birth: 02-21-1951           MRN: [id number] Visit Date: 07/27/2019              Requested by: Maury Dus, MD [address],  [place] [id number] PCP: Maury Dus, MD  Chief Complaint  Patient presents with  . Left Knee - Routine Post Op    07/14/19 left knee scope and debridement       HPI: Patient presents today 2 weeks status post left knee arthroscopy he is doing very well.  He is very pleased with his progress.  He has no complaints.  Assessment & Plan: Visit Diagnoses: No diagnosis found.  Plan: I have offered for him to follow-up in 4 weeks to see how he does.  We discussed quad strengthening and low impact conditioning activities.  He will follow-up as needed  Follow-Up Instructions: No follow-ups on file.   Ortho Exam  Patient is alert, oriented, no adenopathy, well-dressed, normal affect, normal respiratory effort. Left knee: Well-healed arthroscopic portals.  No effusion no surrounding erythema full knee range of motion is appreciated he is able to fire his quad distal CMS is intact  Imaging: No results found. No images are attached to the encounter.  Labs: No results found for: HGBA1C, ESRSEDRATE, CRP, LABURIC, REPTSTATUS, GRAMSTAIN, CULT, LABORGA   No results found for: ALBUMIN, PREALBUMIN, LABURIC  No results found for: MG No results found for: VD25OH  No results found for: PREALBUMIN CBC EXTENDED 03/03/2008 12/10/2007  HGB 15.7 POINT OF CARE RESULT 15.3  HCT - 45.0     Body mass index is 28.06 kg/m.  Orders:  No orders of the defined types were placed in this encounter.  No orders of the defined types were placed in this encounter.    Procedures: No procedures performed  Clinical Data: No additional findings.  ROS:  All other systems negative, except as noted in the HPI. Review of Systems  Objective: Vital Signs: Ht 5\' 9"  (1.753 m)   Wt 190 lb  (86.2 kg)   BMI 28.06 kg/m   Specialty Comments:  No specialty comments available.  PMFS History: Patient Active Problem List   Diagnosis Date Noted  . 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 Depression   . Hypertension   . MMT (medial meniscus tear)    left knee    No family history on file.  Past Surgical History:  Procedure Laterality Date  . EYE SURGERY     as child  . HAND SURGERY Right   . KNEE ARTHROSCOPY WITH MEDIAL MENISECTOMY Left 07/14/2019   Procedure: LEFT KNEE ARTHROSCOPY AND DEBRIDEMENT;  Surgeon: Newt Minion, MD;  Location: [facility];  Service: Orthopedics;  Laterality: Left;   Social History   Occupational History  . Not on file  Tobacco Use  . Smoking status: Never Smoker  . Smokeless tobacco: Never Used  Substance and Sexual Activity  . Alcohol use: Never    Frequency: Never  . Drug use: Never  . Sexual activity: Not on file

## 2019-08-06 DIAGNOSIS — Z20828 Contact with and (suspected) exposure to other viral communicable diseases: Secondary | ICD-10-CM | POA: Diagnosis not present

## 2019-09-10 DIAGNOSIS — J0100 Acute maxillary sinusitis, unspecified: Secondary | ICD-10-CM | POA: Diagnosis not present

## 2019-09-23 DIAGNOSIS — Z8616 Personal history of COVID-19: Secondary | ICD-10-CM | POA: Diagnosis not present

## 2019-09-23 DIAGNOSIS — F330 Major depressive disorder, recurrent, mild: Secondary | ICD-10-CM | POA: Diagnosis not present

## 2019-09-23 DIAGNOSIS — Z125 Encounter for screening for malignant neoplasm of prostate: Secondary | ICD-10-CM | POA: Diagnosis not present

## 2019-09-23 DIAGNOSIS — Z23 Encounter for immunization: Secondary | ICD-10-CM | POA: Diagnosis not present

## 2019-09-23 DIAGNOSIS — J3081 Allergic rhinitis due to animal (cat) (dog) hair and dander: Secondary | ICD-10-CM | POA: Diagnosis not present

## 2019-09-23 DIAGNOSIS — Z136 Encounter for screening for cardiovascular disorders: Secondary | ICD-10-CM | POA: Diagnosis not present

## 2019-09-23 DIAGNOSIS — L301 Dyshidrosis [pompholyx]: Secondary | ICD-10-CM | POA: Diagnosis not present

## 2019-09-23 DIAGNOSIS — I10 Essential (primary) hypertension: Secondary | ICD-10-CM | POA: Diagnosis not present

## 2019-09-23 DIAGNOSIS — Z8042 Family history of malignant neoplasm of prostate: Secondary | ICD-10-CM | POA: Diagnosis not present

## 2019-09-23 DIAGNOSIS — J309 Allergic rhinitis, unspecified: Secondary | ICD-10-CM | POA: Diagnosis not present

## 2019-09-23 DIAGNOSIS — Z0000 Encounter for general adult medical examination without abnormal findings: Secondary | ICD-10-CM | POA: Diagnosis not present

## 2019-09-23 DIAGNOSIS — N529 Male erectile dysfunction, unspecified: Secondary | ICD-10-CM | POA: Diagnosis not present

## 2019-10-09 ENCOUNTER — Emergency Department (HOSPITAL_COMMUNITY)
Admission: EM | Admit: 2019-10-09 | Discharge: 2019-10-09 | Disposition: A | Discharge status: Home / Self Care | Payer: Medicare Other | Attending: Emergency Medicine | Admitting: Emergency Medicine

## 2019-10-09 ENCOUNTER — Encounter (HOSPITAL_COMMUNITY): Payer: Self-pay

## 2019-10-09 ENCOUNTER — Other Ambulatory Visit: Payer: Self-pay

## 2019-10-09 ENCOUNTER — Emergency Department (HOSPITAL_COMMUNITY): Payer: Medicare Other

## 2019-10-09 DIAGNOSIS — Y9389 Activity, other specified: Secondary | ICD-10-CM | POA: Insufficient documentation

## 2019-10-09 DIAGNOSIS — Y9289 Other specified places as the place of occurrence of the external cause: Secondary | ICD-10-CM | POA: Insufficient documentation

## 2019-10-09 DIAGNOSIS — I10 Essential (primary) hypertension: Secondary | ICD-10-CM | POA: Diagnosis not present

## 2019-10-09 DIAGNOSIS — S0990XA Unspecified injury of head, initial encounter: Secondary | ICD-10-CM | POA: Diagnosis not present

## 2019-10-09 DIAGNOSIS — W228XXA Striking against or struck by other objects, initial encounter: Secondary | ICD-10-CM | POA: Insufficient documentation

## 2019-10-09 DIAGNOSIS — Z79899 Other long term (current) drug therapy: Secondary | ICD-10-CM | POA: Diagnosis not present

## 2019-10-09 DIAGNOSIS — S0081XA Abrasion of other part of head, initial encounter: Secondary | ICD-10-CM | POA: Diagnosis not present

## 2019-10-09 DIAGNOSIS — Y998 Other external cause status: Secondary | ICD-10-CM | POA: Diagnosis not present

## 2019-10-09 DIAGNOSIS — R519 Headache, unspecified: Secondary | ICD-10-CM | POA: Diagnosis not present

## 2019-10-09 MED ORDER — HYDROCODONE-ACETAMINOPHEN 5-325 MG PO TABS
1.0000 | ORAL_TABLET | Freq: Once | ORAL | Status: AC
  Administered 2019-10-09: 1 via ORAL
  Filled 2019-10-09: qty 1

## 2019-10-09 MED ORDER — ONDANSETRON HCL 4 MG PO TABS: 4.0000 mg | ORAL_TABLET | Freq: Four times a day (QID) | ORAL | 0 refills | Status: DC

## 2019-10-09 MED ORDER — ONDANSETRON HCL 4 MG PO TABS
4.0000 mg | ORAL_TABLET | Freq: Once | ORAL | Status: AC
  Administered 2019-10-09: 4 mg via ORAL
  Filled 2019-10-09: qty 1

## 2019-10-09 NOTE — Discharge Instructions (Signed)
Please return for any problem.  Follow-up with your regular care provider as instructed. °

## 2019-10-09 NOTE — ED Notes (Signed)
Pt transported to CT ?

## 2019-10-09 NOTE — ED Triage Notes (Signed)
Patient states he was using a saw 3 days ago and a piece of wood flipped, hitting him in the head. Patient c/o headache. Patient denies any dizziness or blurred vision. Patient denies taking anticoagulants.

## 2019-10-09 NOTE — ED Provider Notes (Signed)
[facility]-EMERGENCY DEPT Provider Note   CSN: [id number] Arrival date & time: 10/09/19  1738     History Chief Complaint  Patient presents with  . Head Injury    Dwayne Cross is a 69 y.o. male.  70 year old male with prior medical history as detailed below presents for evaluation of headache status post recent head injury.  Patient reports that on the ninth he was working with his table saw.  As he finished a cut on a piece of wood the wood flipped off the blade and struck him in the forehead.  He did not pass out.  He had significant bruising and swelling around the abrasion on his forehead.  He reports that his tetanus is up-to-date.  He denies use of anticoagulation.  He reports that over the last 2 days the bruising on his forehead is improved.  He reports that this morning he has had a significant headache with some mild nausea.  He took Tylenol at home without improvement.  Of note, he also received the second Covid vaccination yesterday.  This made him feel under the weather with myalgias and low-grade fevers as well.  He is not sure whether his headache today is the result of the vaccination or from the recent head injury.  The history is provided by the patient and medical records.  Head Injury Location:  Frontal Time since incident:  4 days Mechanism of injury: direct blow   Pain details:    Quality:  Aching   Radiates to:  Face   Severity:  Mild   Duration:  2 days   Timing:  Constant   Progression:  Waxing and waning Chronicity:  New Relieved by:  Nothing Worsened by:  Nothing      Past Medical History:  Diagnosis Date  . Depression   . Hypertension   . MMT (medial meniscus tear)    left knee    Patient Active Problem List   Diagnosis Date Noted  . 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 EYE SURGERY     as child  . HAND SURGERY Right   . KNEE ARTHROSCOPY  WITH MEDIAL MENISECTOMY Left 07/14/2019   Procedure: LEFT KNEE ARTHROSCOPY AND DEBRIDEMENT;  Surgeon: Nadara Mustard, MD;  Location: [facility];  Service: Orthopedics;  Laterality: Left;       Family History  Problem Relation Age of Onset  . Hypertension Mother   . Hypertension Father   . Heart failure Father   . Cancer Father     Social History   Tobacco Use  . Smoking status: Never Smoker  . Smokeless tobacco: Never Used  Substance Use Topics  . Alcohol use: Never  . Drug use: Never    Home Medications Prior to Admission medications   Medication Sig Start Date End Date Taking? Authorizing Provider  buPROPion (WELLBUTRIN XL) 300 MG 24 hr tablet Take 300 mg by mouth daily.    [provider]  fluticasone (FLONASE) 50 MCG/ACT nasal spray Place into both nostrils daily.    [provider]  oxyCODONE-acetaminophen (PERCOCET/ROXICET) 5-325 MG tablet Take 1 tablet by mouth every 4 (four) hours as needed for severe pain. 07/14/19   Nadara Mustard, MD  valsartan-hydrochlorothiazide (DIOVAN-HCT) 160-12.5 MG tablet Take 1 tablet by mouth daily.    [provider]    Allergies    Morphine and related  Review of Systems   Review of Systems  All other systems reviewed and are negative.   Physical Exam Updated Vital Signs BP (!) 145/110 (BP Location: Left Arm)   Pulse 82   Temp 99.4 F (37.4 C) (Oral)   Resp 18   Ht 5\' 8"  (1.727 m)   Wt 87.1 kg   SpO2 93%   BMI 29.19 kg/m   Physical Exam Vitals and nursing note reviewed.  Constitutional:      General: He is not in acute distress.    Appearance: Normal appearance. He is well-developed.  HENT:     Head: Normocephalic.     Comments: Vertical linear abrasion to the forehead.  Mild dependent ecchymosis beneath both orbits. Eyes:     Conjunctiva/sclera: Conjunctivae normal.     Pupils: Pupils are equal, round, and reactive to light.  Cardiovascular:     Rate and Rhythm: Normal  rate and regular rhythm.     Heart sounds: Normal heart sounds.  Pulmonary:     Effort: Pulmonary effort is normal. No respiratory distress.     Breath sounds: Normal breath sounds.  Abdominal:     General: There is no distension.     Palpations: Abdomen is soft.     Tenderness: There is no abdominal tenderness.  Musculoskeletal:        General: No deformity. Normal range of motion.     Cervical back: Normal range of motion and neck supple.  Skin:    General: Skin is warm and dry.  Neurological:     General: No focal deficit present.     Mental Status: He is alert and oriented to person, place, and time.     Cranial Nerves: No cranial nerve deficit.     Sensory: No sensory deficit.     Motor: No weakness.     Coordination: Coordination normal.     Gait: Gait normal.     ED Results / Procedures / Treatments   Labs (all labs ordered are listed, but only abnormal results are displayed) Labs Reviewed - No data to display  EKG None  Radiology CT Head Wo Contrast  Result Date: 10/09/2019 CLINICAL DATA:  Head trauma, headache EXAM: CT HEAD WITHOUT CONTRAST TECHNIQUE: Contiguous axial images were obtained from the base of the skull through the vertex without intravenous contrast. COMPARISON:  None. FINDINGS: Brain: No acute infarct or hemorrhage. Lateral ventricles and midline structures are unremarkable. No acute extra-axial fluid collections. No mass effect. Vascular: No hyperdense vessel or unexpected calcification. Skull: Normal. Negative for fracture or focal lesion. Sinuses/Orbits: No acute finding. Other: None IMPRESSION: 1. No acute intracranial process. Electronically Signed   By: Randa Ngo M.D.   On: 10/09/2019 18:39    Procedures Procedures (including critical care time)  Medications Ordered in ED Medications  ondansetron (ZOFRAN) tablet 4 mg (has no administration in time range)  HYDROcodone-acetaminophen (NORCO/VICODIN) 5-325 MG per tablet 1 tablet (has no  administration in time range)    ED Course  I have reviewed the triage vital signs and the nursing notes.  Pertinent labs & imaging results that were available during my care of the patient were reviewed by me and considered in my medical decision making (see chart for details).    MDM Rules/Calculators/A&P                      MDM  Screen complete  NAPHTALI ZYWICKI was evaluated in Emergency Department on 10/09/2019 for the  symptoms described in the history of present illness. He was evaluated in the context of the global COVID-19 pandemic, which necessitated consideration that the patient might be at risk for infection with the SARS-CoV-2 virus that causes COVID-19. Institutional protocols and algorithms that pertain to the evaluation of patients at risk for COVID-19 are in a state of rapid change based on information released by regulatory bodies including the CDC and federal and state organizations. These policies and algorithms were followed during the patient's care in the ED.  Patient is presenting for evaluation of headache following reported recent head injury.  Patient's exam is not suggestive of significant acute traumatic injury.  CT imaging does not show acute intercranial process.  Patient feels improved.  He desires discharge home.  Importance of close follow-up is stressed.    Final Clinical Impression(s) / ED Diagnoses Final diagnoses:  Closed head injury, initial encounter  Nonintractable headache, unspecified chronicity pattern, unspecified headache type  Injury of head, initial encounter    Rx / DC Orders ED Discharge Orders         Ordered    ondansetron (ZOFRAN) 4 MG tablet  Every 6 hours     10/09/19 1914           Wynetta Fines, MD 10/09/19 [phone]

## 2019-10-09 NOTE — ED Notes (Signed)
Pt ambulated to RR without assistance and steady gait.

## 2020-03-09 DIAGNOSIS — H2513 Age-related nuclear cataract, bilateral: Secondary | ICD-10-CM | POA: Diagnosis not present

## 2020-07-11 DIAGNOSIS — L820 Inflamed seborrheic keratosis: Secondary | ICD-10-CM | POA: Diagnosis not present

## 2020-07-11 DIAGNOSIS — Z23 Encounter for immunization: Secondary | ICD-10-CM | POA: Diagnosis not present

## 2020-07-29 IMAGING — CT CT HEAD W/O CM
4 series · 17 of 47 positions shown, 19 images · non-contrast
Comparison: None.

CLINICAL DATA: Head trauma, headache

EXAM:
CT HEAD WITHOUT CONTRAST
TECHNIQUE: Contiguous axial images were obtained from the base of the skull
through the vertex without intravenous contrast.

[Series 2: head wo · axial · 0.44mm/px · z∈[+1564,+1684]mm · 7 of 33 slices shown, 9 images]
[im 5/33  brain]
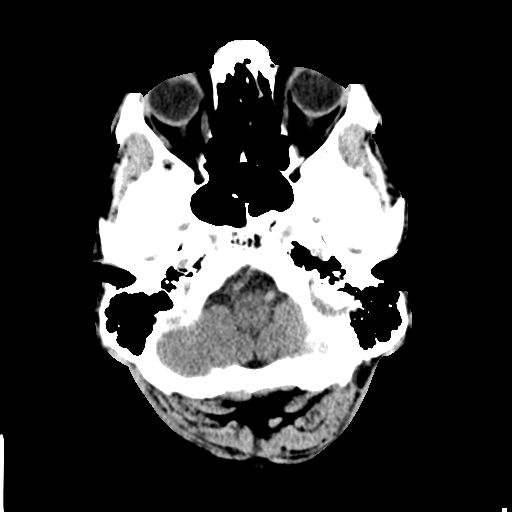
[im 5/33  bone]
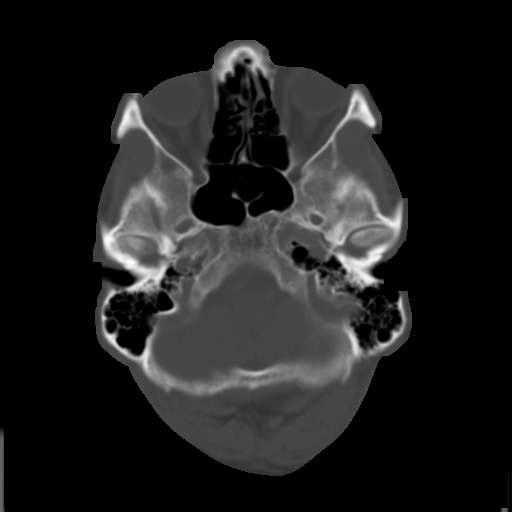
[im 9/33  brain]
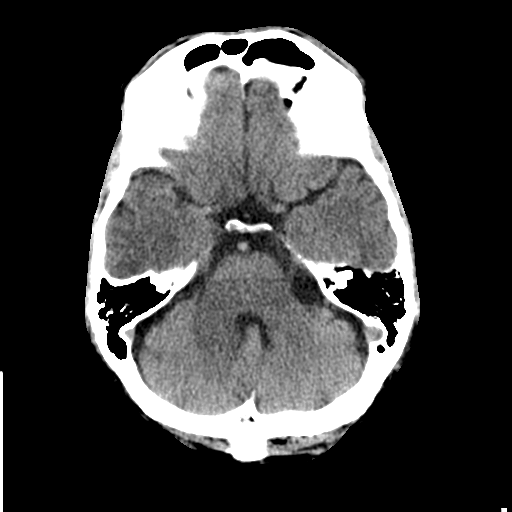
[im 13/33  brain]
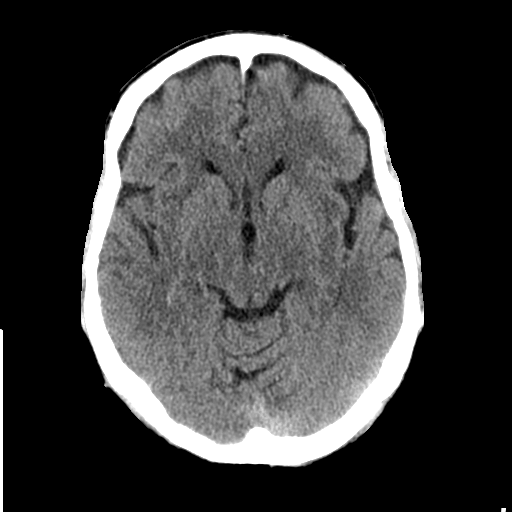
[im 17/33  brain]
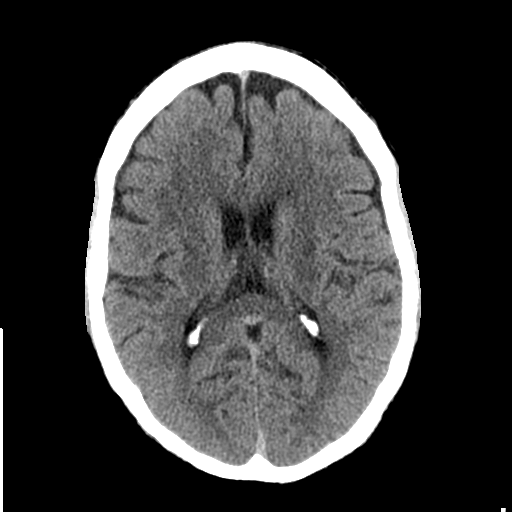
[im 21/33  brain]
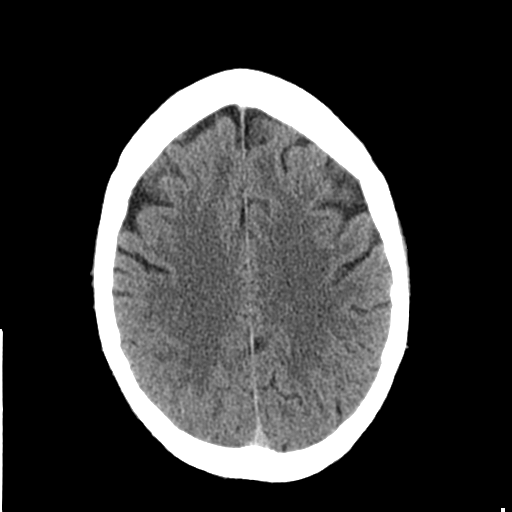
[im 21/33  bone]
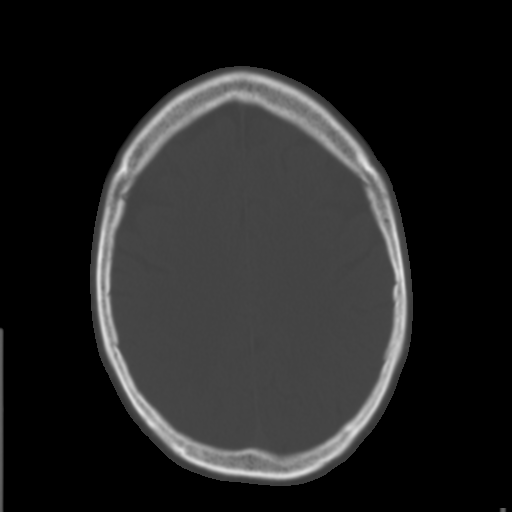
[im 25/33  brain]
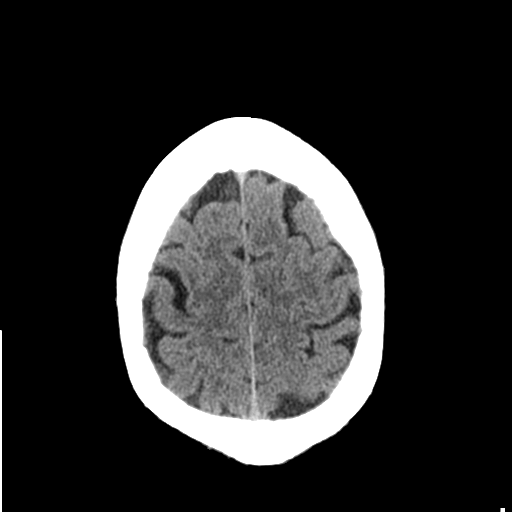
[im 29/33  brain]
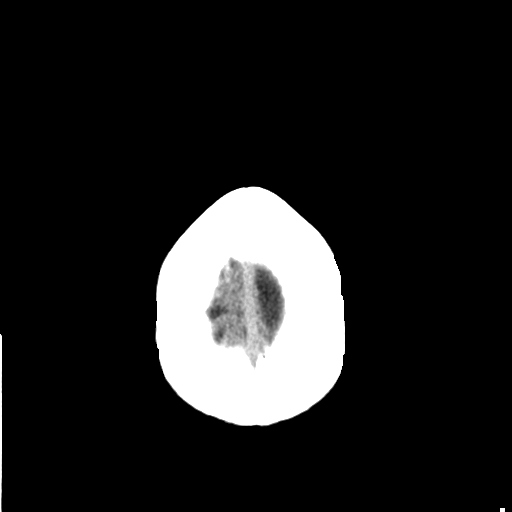

[Series 3: head bone · axial · 0.44mm/px · z∈[+1560,+1616]mm · 4 of 82 slices shown]
[im 9/82  bone]
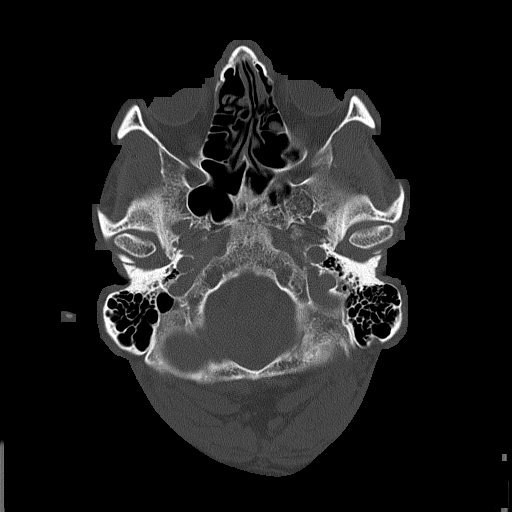
[im 17/82  bone]
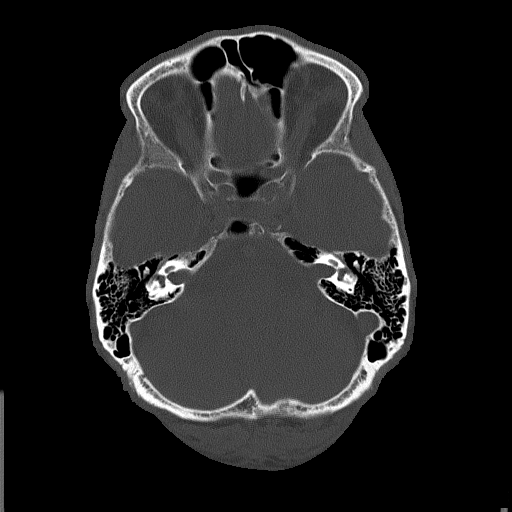
[im 25/82  bone]
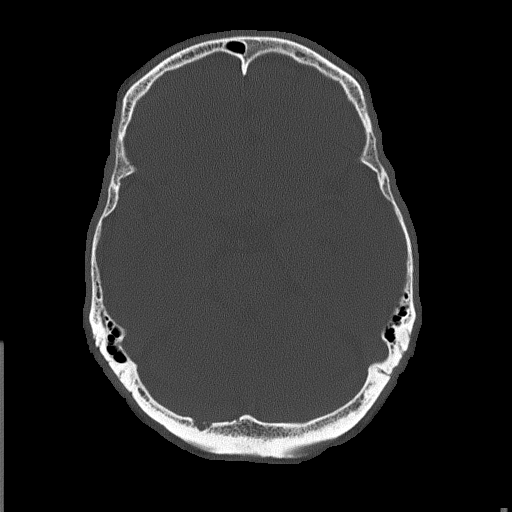
[im 37/82  bone]
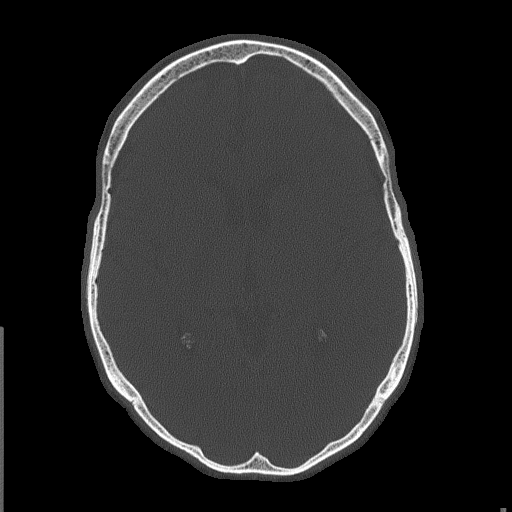

[Series 5: coronal soft tissue · coronal · 0.32mm/px · 3 of 70 slices shown]
[im 24/70  brain]
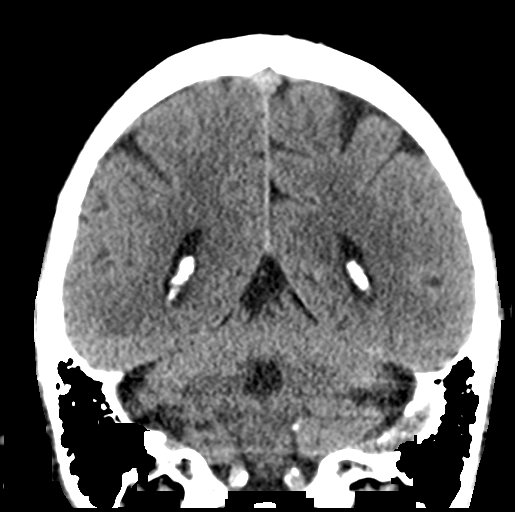
[im 31/70  brain]
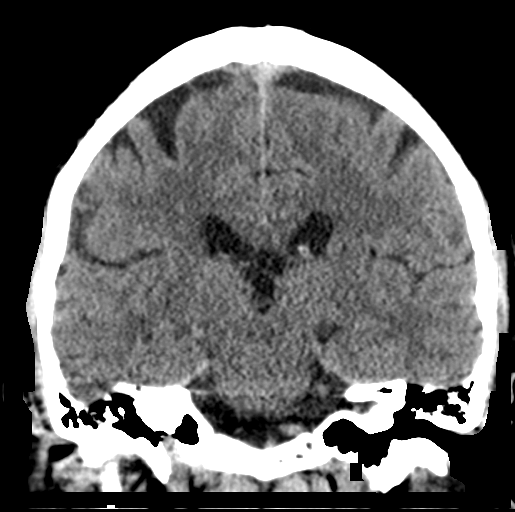
[im 39/70  brain]
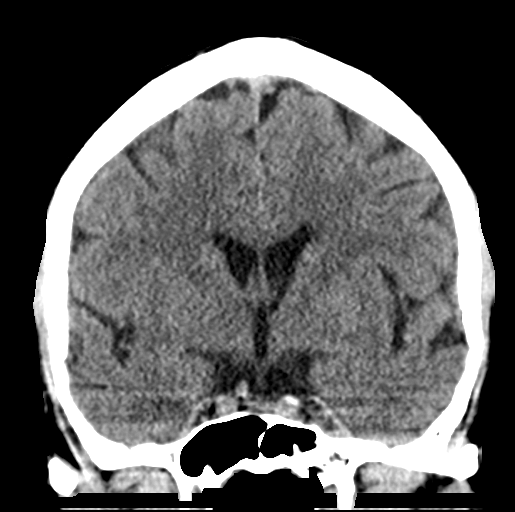

[Series 6: sagittal soft tissue · sagittal · 0.32mm/px · 3 of 56 slices shown]
[im 19/56  brain]
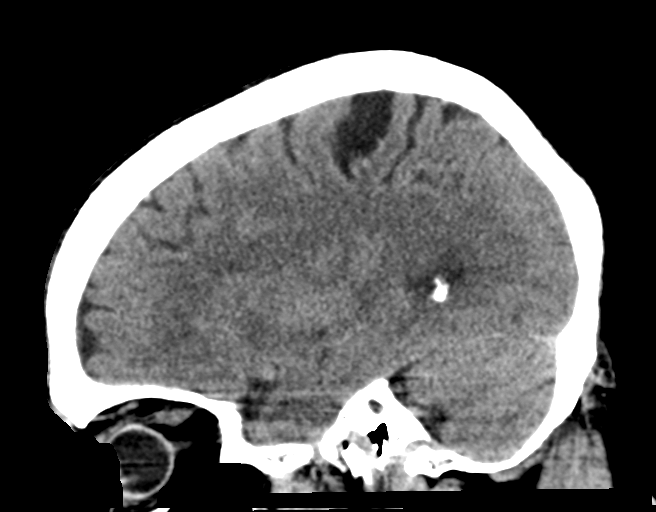
[im 28/56  brain]
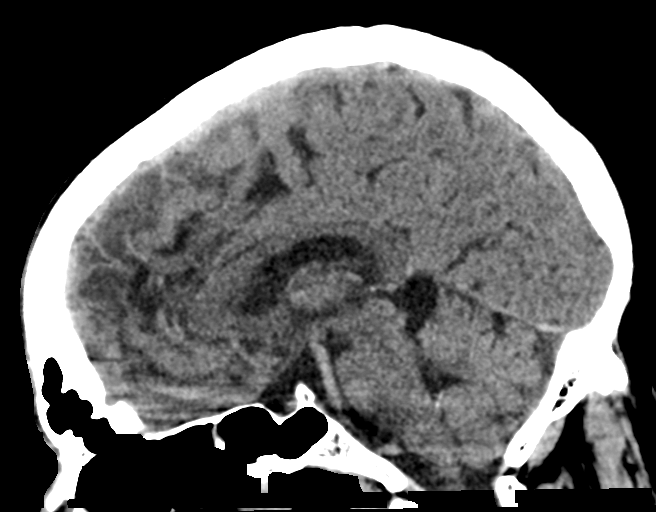
[im 37/56  brain]
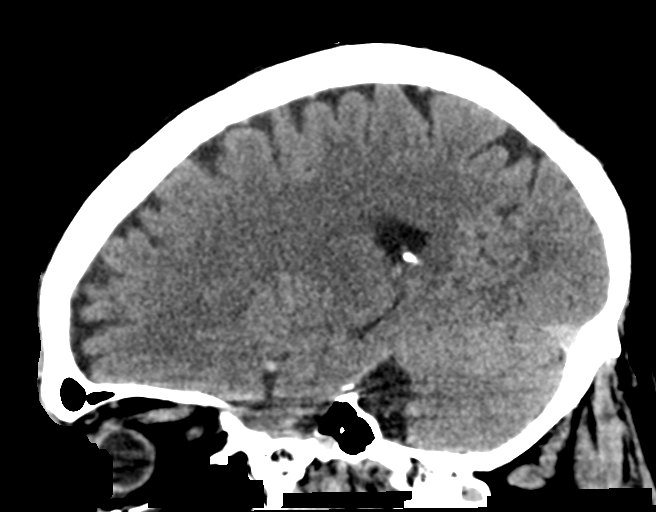

[17 of 47 positions shown; findings below may reference images not displayed]

FINDINGS: Brain: No acute infarct or hemorrhage. Lateral ventricles and
midline structures are unremarkable. No acute extra-axial fluid
collections. No mass effect.

Vascular: No hyperdense vessel or unexpected calcification.

Skull: Normal. Negative for fracture or focal lesion.

Sinuses/Orbits: No acute finding.

Other: None
IMPRESSION: 1. No acute intracranial process.

## 2020-10-05 DIAGNOSIS — L301 Dyshidrosis [pompholyx]: Secondary | ICD-10-CM | POA: Diagnosis not present

## 2020-10-05 DIAGNOSIS — I10 Essential (primary) hypertension: Secondary | ICD-10-CM | POA: Diagnosis not present

## 2020-10-05 DIAGNOSIS — Z0000 Encounter for general adult medical examination without abnormal findings: Secondary | ICD-10-CM | POA: Diagnosis not present

## 2020-10-05 DIAGNOSIS — J309 Allergic rhinitis, unspecified: Secondary | ICD-10-CM | POA: Diagnosis not present

## 2021-04-28 DIAGNOSIS — J069 Acute upper respiratory infection, unspecified: Secondary | ICD-10-CM | POA: Diagnosis not present

## 2021-04-28 DIAGNOSIS — W57XXXA Bitten or stung by nonvenomous insect and other nonvenomous arthropods, initial encounter: Secondary | ICD-10-CM | POA: Diagnosis not present

## 2021-04-28 DIAGNOSIS — R519 Headache, unspecified: Secondary | ICD-10-CM | POA: Diagnosis not present

## 2021-04-28 DIAGNOSIS — R6883 Chills (without fever): Secondary | ICD-10-CM | POA: Diagnosis not present

## 2021-04-28 DIAGNOSIS — Z03818 Encounter for observation for suspected exposure to other biological agents ruled out: Secondary | ICD-10-CM | POA: Diagnosis not present

## 2021-04-28 DIAGNOSIS — S90562A Insect bite (nonvenomous), left ankle, initial encounter: Secondary | ICD-10-CM | POA: Diagnosis not present

## 2021-08-08 DIAGNOSIS — J069 Acute upper respiratory infection, unspecified: Secondary | ICD-10-CM | POA: Diagnosis not present

## 2021-08-08 DIAGNOSIS — J029 Acute pharyngitis, unspecified: Secondary | ICD-10-CM | POA: Diagnosis not present

## 2021-10-23 DIAGNOSIS — Z0000 Encounter for general adult medical examination without abnormal findings: Secondary | ICD-10-CM | POA: Diagnosis not present

## 2021-10-23 DIAGNOSIS — Z125 Encounter for screening for malignant neoplasm of prostate: Secondary | ICD-10-CM | POA: Diagnosis not present

## 2021-10-23 DIAGNOSIS — J3081 Allergic rhinitis due to animal (cat) (dog) hair and dander: Secondary | ICD-10-CM | POA: Diagnosis not present

## 2021-10-23 DIAGNOSIS — I10 Essential (primary) hypertension: Secondary | ICD-10-CM | POA: Diagnosis not present

## 2021-10-23 DIAGNOSIS — J309 Allergic rhinitis, unspecified: Secondary | ICD-10-CM | POA: Diagnosis not present

## 2021-11-20 DIAGNOSIS — R972 Elevated prostate specific antigen [PSA]: Secondary | ICD-10-CM | POA: Diagnosis not present

## 2021-11-27 ENCOUNTER — Ambulatory Visit (INDEPENDENT_AMBULATORY_CARE_PROVIDER_SITE_OTHER): Payer: Medicare Other

## 2021-11-27 ENCOUNTER — Encounter: Payer: Self-pay | Admitting: Orthopedic Surgery

## 2021-11-27 ENCOUNTER — Ambulatory Visit (INDEPENDENT_AMBULATORY_CARE_PROVIDER_SITE_OTHER): Payer: Medicare Other | Admitting: Surgical

## 2021-11-27 DIAGNOSIS — M25561 Pain in right knee: Secondary | ICD-10-CM | POA: Diagnosis not present

## 2021-11-27 DIAGNOSIS — M25461 Effusion, right knee: Secondary | ICD-10-CM

## 2021-11-27 MED ORDER — METHYLPREDNISOLONE ACETATE 40 MG/ML IJ SUSP
40.0000 mg | INTRAMUSCULAR | Status: AC | PRN
  Administered 2021-11-27: 40 mg via INTRA_ARTICULAR

## 2021-11-27 MED ORDER — LIDOCAINE HCL 1 % IJ SOLN
5.0000 mL | INTRAMUSCULAR | Status: AC | PRN
  Administered 2021-11-27: 5 mL

## 2021-11-27 MED ORDER — BUPIVACAINE HCL 0.25 % IJ SOLN
4.0000 mL | INTRAMUSCULAR | Status: AC | PRN
  Administered 2021-11-27: 4 mL via INTRA_ARTICULAR

## 2021-11-27 NOTE — Progress Notes (Signed)
? ?Office Visit Note ?  ?Patient: Dwayne Cross           ?Date of Birth: 08/26/1951           ?MRN: [id number] ?Visit Date: 11/27/2021 ?Requested by: Dwayne Else, MD ?[phone] ?[address] ?[place],  [place] [address] ?PCP: Dwayne Else, MD ? ?Subjective: ?Chief Complaint  ?Patient presents with  ? Right Knee - Pain  ? ? ?HPI: Dwayne Cross is a 71 y.o. male who presents to the office complaining of right knee pain.  Patient states he had acute onset while mowing the lawn about a week ago where he was walking and his foot slipped behind him.  Since that he has noticed increased right knee pain.  Localizes pain to the medial aspect of the knee with occasional anterior pain.  The pain will radiate down his shin about halfway when he tries to sleep.  He has difficulty sleeping at night due to the pain.  He also notes increased pain with pushing on the accelerator when he is driving.  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  Enjoys playing basketball on occasion and doing yard work around his house.  He has tried ibuprofen and ice without much relief.  Denies any inability to weight-bear, mechanical symptoms, giving out of the right knee, history of gout, ecchymosis.   ?             ?ROS: All systems reviewed are negative as they relate to the chief complaint within the history of present illness.  Patient denies fevers or chills. ? ?Assessment & Plan: ?Visit Diagnoses:  ?1. Effusion, right knee   ?2. Right knee pain, unspecified chronicity   ? ? ?Plan: Patient is a 71 year old male who presents for evaluation of right knee pain.  Acute onset about a week ago when his right foot slipped behind him and he began to notice right knee pain.  Radiographs are unremarkable and he has well-preserved joint spaces without any evidence of acute injury.  No history of prior injury or surgery.  Does have history of prior left knee arthroscopy with debridement for meniscal tear.  After discussion of options, he would like to try  right knee injection.  Tolerated the injection well.  15 cc aspirated from the right knee.  He will call the office in 2 weeks if he has no relief from injection and we will order MRI of the right knee for evaluation of potential meniscal tear.  Otherwise follow-up as needed. ? ?Follow-Up Instructions: No follow-ups on file.  ? ?Orders:  ?Orders Placed This Encounter  ?Procedures  ? XR KNEE 3 VIEW RIGHT  ? ?No orders of the defined types were placed in this encounter. ? ? ? ? Procedures: ?Large Joint Inj: R knee on 11/27/2021 10:32 AM ?Indications: diagnostic evaluation, joint swelling and pain ?Details: 18 G 1.5 in needle, superolateral approach ? ?Arthrogram: No ? ?Medications: 5 mL lidocaine 1 %; 40 mg methylPREDNISolone acetate 40 MG/ML; 4 mL bupivacaine 0.25 % ?Aspirate: 15 mL ?Outcome: tolerated well, no immediate complications ?Procedure, treatment alternatives, risks and benefits explained, specific risks discussed. Consent was given by the patient. Immediately prior to procedure a time out was called to verify the correct patient, procedure, equipment, support staff and site/side marked as required. Patient was prepped and draped in the usual sterile fashion.  ? ? ? ? ?Clinical Data: ?No additional findings. ? ?Objective: ?Vital Signs: There were no vitals taken for this  visit. ? ?Physical Exam:  ?Constitutional: Patient appears well-developed ?HEENT:  ?Head: Normocephalic ?Eyes:EOM are normal ?Neck: Normal range of motion ?Cardiovascular: Normal rate ?Pulmonary/chest: Effort normal ?Neurologic: Patient is alert ?Skin: Skin is warm ?Psychiatric: Patient has normal mood and affect ? ?Ortho Exam: Right knee with small effusion.  0 degrees extension and 120 degrees knee flexion.  Tenderness over the medial and posterior medial joint line.  No tenderness over the patellar tendon, quadricep tendon, patella, lateral joint line.  No ecchymosis noted.  Able to perform straight leg raise without extensor lag.   Excellent quad strength rated 5/5.  No calf tenderness.  No pain with hip range of motion. ? ?Specialty Comments:  ?No specialty comments available. ? ?Imaging: ?No results found. ? ? ?PMFS History: ?Patient Active Problem List  ? Diagnosis Date Noted  ? 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 ?Past Medical History:  ?Diagnosis Date  ? Depression   ? Hypertension   ? MMT (medial meniscus tear)   ? left knee  ?  ?Family History  ?Problem Relation Age of Onset  ? Hypertension Mother   ? Hypertension Father   ? Heart failure Father   ? Cancer Father   ?  ?Past Surgical History:  ?Procedure Laterality Date  ? EYE SURGERY    ? as child  ? HAND SURGERY Right   ? KNEE ARTHROSCOPY WITH MEDIAL MENISECTOMY Left 07/14/2019  ? Procedure: LEFT KNEE ARTHROSCOPY AND DEBRIDEMENT;  Surgeon: Nadara Mustard, MD;  Location: [facility];  Service: Orthopedics;  Laterality: Left;  ? ?Social History  ? ?Occupational History  ? Not on file  ?Tobacco Use  ? Smoking status: Never  ? Smokeless tobacco: Never  ?Vaping Use  ? Vaping Use: Never used  ?Substance and Sexual Activity  ? Alcohol use: Never  ? Drug use: Never  ? Sexual activity: Not on file  ? ? ? ? ?  ?

## 2022-01-12 DIAGNOSIS — Z03818 Encounter for observation for suspected exposure to other biological agents ruled out: Secondary | ICD-10-CM | POA: Diagnosis not present

## 2022-01-12 DIAGNOSIS — R0981 Nasal congestion: Secondary | ICD-10-CM | POA: Diagnosis not present

## 2022-01-12 DIAGNOSIS — R52 Pain, unspecified: Secondary | ICD-10-CM | POA: Diagnosis not present

## 2022-01-12 DIAGNOSIS — R5383 Other fatigue: Secondary | ICD-10-CM | POA: Diagnosis not present

## 2022-05-11 ENCOUNTER — Ambulatory Visit: Payer: Medicare Other | Admitting: Surgical

## 2022-05-22 ENCOUNTER — Ambulatory Visit (INDEPENDENT_AMBULATORY_CARE_PROVIDER_SITE_OTHER): Payer: Medicare Other | Admitting: Surgical

## 2022-05-22 DIAGNOSIS — M25561 Pain in right knee: Secondary | ICD-10-CM | POA: Diagnosis not present

## 2022-05-24 ENCOUNTER — Encounter: Payer: Self-pay | Admitting: Surgical

## 2022-05-24 NOTE — Progress Notes (Signed)
Follow-up Office Visit Note   Patient: Dwayne Cross           Date of Birth: 01/18/1951           MRN: [id number] Visit Date: 05/22/2022 Requested by: Maury Dus, MD [address] [place],  [place] [id number] PCP: Maury Dus, MD  Subjective: Chief Complaint  Patient presents with   Right Knee - Pain    HPI: Dwayne Cross is a 71 y.o. male who returns to the office for follow-up visit.    Plan at last visit was: Patient is a 71 year old male who presents for evaluation of right knee pain.  Acute onset about a week ago when his right foot slipped behind him and he began to notice right knee pain.  Radiographs are unremarkable and he has well-preserved joint spaces without any evidence of acute injury.  No history of prior injury or surgery.  Does have history of prior left knee arthroscopy with debridement for meniscal tear.  After discussion of options, he would like to try right knee injection.  Tolerated the injection well.  15 cc aspirated from the right knee.  He will call the office in 2 weeks if he has no relief from injection and we will order MRI of the right knee for evaluation of potential meniscal tear.  Otherwise follow-up as needed.  Since then, patient notes he had great relief from the injection for several months up until the last 3 to 4 weeks where he has had some return of his pain.  Mostly localizes to the posterior aspect of the right knee.  Does wake with pain about once per night.  Taking Tylenol and Advil.  Describes the pain as an aching pain.  No groin pain or radicular pain.  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  Not currently bothering him enough for any intervention.              ROS: All systems reviewed are negative as they relate to the chief complaint within the history of present illness.  Patient denies fevers or chills.  Assessment & Plan: Visit Diagnoses:  1. Right knee pain, unspecified chronicity     Plan:  Dwayne Cross is a 71 y.o. male who returns to the office for follow-up visit for right knee pain.  Plan from last visit was noted above in HPI.  They now return with return of right knee pain.  Had excellent relief from injection back in April.  Pain is returned in the last month and is waking him up at night but overall he can tolerate his pain is not really bothering him enough for any significant intervention at this time.  Did offer repeat injection or MRI for further evaluation of source of pain but he would like to see if this pain will resolve on its own in the next several weeks.  At most would want to do 1 more injection but if he has continued pain even after that injection, think he should have MRI with his radiographs that are negative for any significant pathology.  Follow-up as needed.  He may call the office if he would like to order MRI scan or get set up for injection.  Follow-Up Instructions: No follow-ups on file.   Orders:  No orders of the defined types were placed in this encounter.  No orders of the defined types were placed in this encounter.  Procedures: No procedures performed   Clinical Data: No additional findings.  Objective: Vital Signs: There were no vitals taken for this visit.  Physical Exam:  Constitutional: Patient appears well-developed HEENT:  Head: Normocephalic Eyes:EOM are normal Neck: Normal range of motion Cardiovascular: Normal rate Pulmonary/chest: Effort normal Neurologic: Patient is alert Skin: Skin is warm Psychiatric: Patient has normal mood and affect  Ortho Exam: Ortho exam demonstrates Ortho exam demonstrates right knee with no significant effusion.  Tenderness over the medial joint line mildly.  No tenderness over the lateral joint line.  No pain with hip range of motion.  Able to perform straight leg raise without extensor lag.  Negative straight leg raise.  No cellulitis or skin changes noted.  Range of motion from 0 degrees  extension to 120 degrees of knee flexion.  Specialty Comments:  No specialty comments available.  Imaging: No results found.   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, left knee    Past Medical History:  Diagnosis Date   Depression    Hypertension    MMT (medial meniscus tear)    left knee    Family History  Problem Relation Age of Onset   Hypertension Mother    Hypertension Father    Heart failure Father    Cancer Father     Past Surgical History:  Procedure Laterality Date   EYE SURGERY     as child   HAND SURGERY Right    KNEE ARTHROSCOPY WITH MEDIAL MENISECTOMY Left 07/14/2019   Procedure: LEFT KNEE ARTHROSCOPY AND DEBRIDEMENT;  Surgeon: Nadara Mustard, MD;  Location: [facility];  Service: Orthopedics;  Laterality: Left;   Social History   Occupational History   Not on file  Tobacco Use   Smoking status: Never   Smokeless tobacco: Never  Vaping Use   Vaping Use: Never used  Substance and Sexual Activity   Alcohol use: Never   Drug use: Never   Sexual activity: Not on file

## 2022-06-14 DIAGNOSIS — N401 Enlarged prostate with lower urinary tract symptoms: Secondary | ICD-10-CM | POA: Diagnosis not present

## 2022-06-14 DIAGNOSIS — I10 Essential (primary) hypertension: Secondary | ICD-10-CM | POA: Diagnosis not present

## 2022-06-14 DIAGNOSIS — R3914 Feeling of incomplete bladder emptying: Secondary | ICD-10-CM | POA: Diagnosis not present

## 2022-06-14 DIAGNOSIS — R3915 Urgency of urination: Secondary | ICD-10-CM | POA: Diagnosis not present

## 2022-08-27 DIAGNOSIS — B349 Viral infection, unspecified: Secondary | ICD-10-CM | POA: Diagnosis not present

## 2022-09-13 DIAGNOSIS — K080 Exfoliation of teeth due to systemic causes: Secondary | ICD-10-CM | POA: Diagnosis not present

## 2022-09-21 DIAGNOSIS — N401 Enlarged prostate with lower urinary tract symptoms: Secondary | ICD-10-CM | POA: Diagnosis not present

## 2022-09-21 DIAGNOSIS — R350 Frequency of micturition: Secondary | ICD-10-CM | POA: Diagnosis not present

## 2022-09-21 DIAGNOSIS — R3912 Poor urinary stream: Secondary | ICD-10-CM | POA: Diagnosis not present

## 2022-09-21 DIAGNOSIS — R3915 Urgency of urination: Secondary | ICD-10-CM | POA: Diagnosis not present

## 2022-10-01 DIAGNOSIS — K080 Exfoliation of teeth due to systemic causes: Secondary | ICD-10-CM | POA: Diagnosis not present

## 2022-10-30 DIAGNOSIS — N401 Enlarged prostate with lower urinary tract symptoms: Secondary | ICD-10-CM | POA: Diagnosis not present

## 2022-11-05 DIAGNOSIS — R972 Elevated prostate specific antigen [PSA]: Secondary | ICD-10-CM | POA: Diagnosis not present

## 2022-11-05 DIAGNOSIS — R350 Frequency of micturition: Secondary | ICD-10-CM | POA: Diagnosis not present

## 2022-11-05 DIAGNOSIS — N401 Enlarged prostate with lower urinary tract symptoms: Secondary | ICD-10-CM | POA: Diagnosis not present

## 2022-11-05 DIAGNOSIS — R351 Nocturia: Secondary | ICD-10-CM | POA: Diagnosis not present

## 2022-11-29 DIAGNOSIS — K080 Exfoliation of teeth due to systemic causes: Secondary | ICD-10-CM | POA: Diagnosis not present

## 2022-12-03 DIAGNOSIS — E7800 Pure hypercholesterolemia, unspecified: Secondary | ICD-10-CM | POA: Diagnosis not present

## 2022-12-03 DIAGNOSIS — M25532 Pain in left wrist: Secondary | ICD-10-CM | POA: Diagnosis not present

## 2022-12-03 DIAGNOSIS — Z0000 Encounter for general adult medical examination without abnormal findings: Secondary | ICD-10-CM | POA: Diagnosis not present

## 2022-12-03 DIAGNOSIS — I10 Essential (primary) hypertension: Secondary | ICD-10-CM | POA: Diagnosis not present

## 2022-12-03 DIAGNOSIS — F330 Major depressive disorder, recurrent, mild: Secondary | ICD-10-CM | POA: Diagnosis not present

## 2023-02-13 DIAGNOSIS — D3132 Benign neoplasm of left choroid: Secondary | ICD-10-CM | POA: Diagnosis not present

## 2023-03-01 DIAGNOSIS — H524 Presbyopia: Secondary | ICD-10-CM | POA: Diagnosis not present

## 2023-03-04 DIAGNOSIS — J40 Bronchitis, not specified as acute or chronic: Secondary | ICD-10-CM | POA: Diagnosis not present

## 2023-03-29 DIAGNOSIS — Z1211 Encounter for screening for malignant neoplasm of colon: Secondary | ICD-10-CM | POA: Diagnosis not present

## 2023-03-29 DIAGNOSIS — D123 Benign neoplasm of transverse colon: Secondary | ICD-10-CM | POA: Diagnosis not present

## 2023-05-13 DIAGNOSIS — R350 Frequency of micturition: Secondary | ICD-10-CM | POA: Diagnosis not present

## 2023-05-13 DIAGNOSIS — R972 Elevated prostate specific antigen [PSA]: Secondary | ICD-10-CM | POA: Diagnosis not present

## 2023-05-13 DIAGNOSIS — R3912 Poor urinary stream: Secondary | ICD-10-CM | POA: Diagnosis not present

## 2023-05-13 DIAGNOSIS — N401 Enlarged prostate with lower urinary tract symptoms: Secondary | ICD-10-CM | POA: Diagnosis not present

## 2023-05-13 DIAGNOSIS — R3915 Urgency of urination: Secondary | ICD-10-CM | POA: Diagnosis not present

## 2023-05-14 DIAGNOSIS — M25532 Pain in left wrist: Secondary | ICD-10-CM | POA: Diagnosis not present

## 2023-05-14 DIAGNOSIS — J069 Acute upper respiratory infection, unspecified: Secondary | ICD-10-CM | POA: Diagnosis not present

## 2023-05-14 DIAGNOSIS — Z23 Encounter for immunization: Secondary | ICD-10-CM | POA: Diagnosis not present

## 2023-05-14 DIAGNOSIS — I10 Essential (primary) hypertension: Secondary | ICD-10-CM | POA: Diagnosis not present

## 2023-05-14 DIAGNOSIS — M545 Low back pain, unspecified: Secondary | ICD-10-CM | POA: Diagnosis not present

## 2023-05-21 DIAGNOSIS — R972 Elevated prostate specific antigen [PSA]: Secondary | ICD-10-CM | POA: Diagnosis not present

## 2023-05-21 DIAGNOSIS — R3915 Urgency of urination: Secondary | ICD-10-CM | POA: Diagnosis not present

## 2023-05-21 DIAGNOSIS — R351 Nocturia: Secondary | ICD-10-CM | POA: Diagnosis not present

## 2023-05-21 DIAGNOSIS — N401 Enlarged prostate with lower urinary tract symptoms: Secondary | ICD-10-CM | POA: Diagnosis not present

## 2023-05-29 DIAGNOSIS — M24132 Other articular cartilage disorders, left wrist: Secondary | ICD-10-CM | POA: Diagnosis not present

## 2023-05-29 DIAGNOSIS — M13842 Other specified arthritis, left hand: Secondary | ICD-10-CM | POA: Diagnosis not present

## 2023-08-12 DIAGNOSIS — M24132 Other articular cartilage disorders, left wrist: Secondary | ICD-10-CM | POA: Diagnosis not present

## 2023-08-12 DIAGNOSIS — M13842 Other specified arthritis, left hand: Secondary | ICD-10-CM | POA: Diagnosis not present

## 2023-09-05 DIAGNOSIS — M25532 Pain in left wrist: Secondary | ICD-10-CM | POA: Diagnosis not present

## 2023-09-12 DIAGNOSIS — M24132 Other articular cartilage disorders, left wrist: Secondary | ICD-10-CM | POA: Diagnosis not present

## 2023-09-12 DIAGNOSIS — M13842 Other specified arthritis, left hand: Secondary | ICD-10-CM | POA: Diagnosis not present

## 2023-09-12 DIAGNOSIS — M13841 Other specified arthritis, right hand: Secondary | ICD-10-CM | POA: Diagnosis not present

## 2023-10-06 DIAGNOSIS — R059 Cough, unspecified: Secondary | ICD-10-CM | POA: Diagnosis not present

## 2023-10-06 DIAGNOSIS — R52 Pain, unspecified: Secondary | ICD-10-CM | POA: Diagnosis not present

## 2023-10-06 DIAGNOSIS — R509 Fever, unspecified: Secondary | ICD-10-CM | POA: Diagnosis not present

## 2023-10-06 DIAGNOSIS — R5383 Other fatigue: Secondary | ICD-10-CM | POA: Diagnosis not present

## 2023-10-23 DIAGNOSIS — M13841 Other specified arthritis, right hand: Secondary | ICD-10-CM | POA: Diagnosis not present

## 2023-10-23 DIAGNOSIS — M13842 Other specified arthritis, left hand: Secondary | ICD-10-CM | POA: Diagnosis not present

## 2023-10-28 DIAGNOSIS — R059 Cough, unspecified: Secondary | ICD-10-CM | POA: Diagnosis not present

## 2024-01-13 DIAGNOSIS — I10 Essential (primary) hypertension: Secondary | ICD-10-CM | POA: Diagnosis not present

## 2024-01-13 DIAGNOSIS — E7800 Pure hypercholesterolemia, unspecified: Secondary | ICD-10-CM | POA: Diagnosis not present

## 2024-01-13 DIAGNOSIS — J309 Allergic rhinitis, unspecified: Secondary | ICD-10-CM | POA: Diagnosis not present

## 2024-01-13 DIAGNOSIS — Z0000 Encounter for general adult medical examination without abnormal findings: Secondary | ICD-10-CM | POA: Diagnosis not present

## 2024-01-13 DIAGNOSIS — F330 Major depressive disorder, recurrent, mild: Secondary | ICD-10-CM | POA: Diagnosis not present

## 2024-01-13 DIAGNOSIS — B351 Tinea unguium: Secondary | ICD-10-CM | POA: Diagnosis not present

## 2024-01-13 DIAGNOSIS — Z125 Encounter for screening for malignant neoplasm of prostate: Secondary | ICD-10-CM | POA: Diagnosis not present

## 2024-01-28 DIAGNOSIS — N401 Enlarged prostate with lower urinary tract symptoms: Secondary | ICD-10-CM | POA: Diagnosis not present

## 2024-01-28 DIAGNOSIS — R972 Elevated prostate specific antigen [PSA]: Secondary | ICD-10-CM | POA: Diagnosis not present

## 2024-01-28 DIAGNOSIS — R351 Nocturia: Secondary | ICD-10-CM | POA: Diagnosis not present

## 2024-02-04 ENCOUNTER — Other Ambulatory Visit: Payer: Self-pay | Admitting: Adult Health

## 2024-02-04 DIAGNOSIS — R972 Elevated prostate specific antigen [PSA]: Secondary | ICD-10-CM

## 2024-02-05 ENCOUNTER — Encounter: Payer: Self-pay | Admitting: Adult Health

## 2024-02-17 ENCOUNTER — Ambulatory Visit
Admission: RE | Admit: 2024-02-17 | Discharge: 2024-02-17 | Disposition: A | Discharge status: Home / Self Care | Source: Ambulatory Visit | Attending: Adult Health | Admitting: Adult Health

## 2024-02-17 DIAGNOSIS — N4289 Other specified disorders of prostate: Secondary | ICD-10-CM | POA: Diagnosis not present

## 2024-02-17 DIAGNOSIS — R972 Elevated prostate specific antigen [PSA]: Secondary | ICD-10-CM

## 2024-02-17 MED ORDER — GADOPICLENOL 0.5 MMOL/ML IV SOLN
10.0000 mL | Freq: Once | INTRAVENOUS | Status: AC | PRN
  Administered 2024-02-17: 9 mL via INTRAVENOUS

## 2024-03-31 DIAGNOSIS — N4289 Other specified disorders of prostate: Secondary | ICD-10-CM | POA: Diagnosis not present

## 2024-03-31 DIAGNOSIS — C61 Malignant neoplasm of prostate: Secondary | ICD-10-CM | POA: Diagnosis not present

## 2024-03-31 DIAGNOSIS — R972 Elevated prostate specific antigen [PSA]: Secondary | ICD-10-CM | POA: Diagnosis not present

## 2024-03-31 DIAGNOSIS — D075 Carcinoma in situ of prostate: Secondary | ICD-10-CM | POA: Diagnosis not present

## 2024-04-06 ENCOUNTER — Other Ambulatory Visit (HOSPITAL_COMMUNITY): Payer: Self-pay | Admitting: Urology

## 2024-04-06 DIAGNOSIS — C61 Malignant neoplasm of prostate: Secondary | ICD-10-CM

## 2024-04-06 DIAGNOSIS — R972 Elevated prostate specific antigen [PSA]: Secondary | ICD-10-CM

## 2024-04-09 ENCOUNTER — Encounter (HOSPITAL_COMMUNITY)
Admission: RE | Admit: 2024-04-09 | Discharge: 2024-04-09 | Disposition: A | Discharge status: Home / Self Care | Source: Ambulatory Visit | Attending: Urology | Admitting: Urology

## 2024-04-09 DIAGNOSIS — R972 Elevated prostate specific antigen [PSA]: Secondary | ICD-10-CM | POA: Insufficient documentation

## 2024-04-09 DIAGNOSIS — C61 Malignant neoplasm of prostate: Secondary | ICD-10-CM | POA: Diagnosis not present

## 2024-04-09 MED ORDER — FLOTUFOLASTAT F 18 GALLIUM 296-5846 MBQ/ML IV SOLN
8.6000 | Freq: Once | INTRAVENOUS | Status: AC
  Administered 2024-04-09: 8.6 via INTRAVENOUS

## 2024-04-29 DIAGNOSIS — C61 Malignant neoplasm of prostate: Secondary | ICD-10-CM | POA: Diagnosis not present

## 2024-05-18 ENCOUNTER — Other Ambulatory Visit: Payer: Self-pay | Admitting: Urology

## 2024-05-19 DIAGNOSIS — M6281 Muscle weakness (generalized): Secondary | ICD-10-CM | POA: Diagnosis not present

## 2024-05-19 DIAGNOSIS — C61 Malignant neoplasm of prostate: Secondary | ICD-10-CM | POA: Diagnosis not present

## 2024-05-26 ENCOUNTER — Emergency Department (HOSPITAL_COMMUNITY)

## 2024-05-26 ENCOUNTER — Observation Stay (HOSPITAL_COMMUNITY)
Admission: EM | Admit: 2024-05-26 | Discharge: 2024-06-01 | Disposition: A | Discharge status: Home Health | Attending: Internal Medicine | Admitting: Internal Medicine

## 2024-05-26 DIAGNOSIS — K5903 Drug induced constipation: Secondary | ICD-10-CM | POA: Diagnosis not present

## 2024-05-26 DIAGNOSIS — M47813 Spondylosis without myelopathy or radiculopathy, cervicothoracic region: Secondary | ICD-10-CM | POA: Insufficient documentation

## 2024-05-26 DIAGNOSIS — M47816 Spondylosis without myelopathy or radiculopathy, lumbar region: Secondary | ICD-10-CM | POA: Insufficient documentation

## 2024-05-26 DIAGNOSIS — S32019A Unspecified fracture of first lumbar vertebra, initial encounter for closed fracture: Secondary | ICD-10-CM | POA: Diagnosis not present

## 2024-05-26 DIAGNOSIS — M47814 Spondylosis without myelopathy or radiculopathy, thoracic region: Secondary | ICD-10-CM | POA: Diagnosis not present

## 2024-05-26 DIAGNOSIS — M48061 Spinal stenosis, lumbar region without neurogenic claudication: Secondary | ICD-10-CM | POA: Diagnosis not present

## 2024-05-26 DIAGNOSIS — W19XXXA Unspecified fall, initial encounter: Secondary | ICD-10-CM | POA: Diagnosis not present

## 2024-05-26 DIAGNOSIS — Z79899 Other long term (current) drug therapy: Secondary | ICD-10-CM | POA: Insufficient documentation

## 2024-05-26 DIAGNOSIS — E041 Nontoxic single thyroid nodule: Secondary | ICD-10-CM | POA: Diagnosis not present

## 2024-05-26 DIAGNOSIS — S199XXA Unspecified injury of neck, initial encounter: Secondary | ICD-10-CM | POA: Diagnosis not present

## 2024-05-26 DIAGNOSIS — S299XXA Unspecified injury of thorax, initial encounter: Secondary | ICD-10-CM | POA: Diagnosis not present

## 2024-05-26 DIAGNOSIS — N289 Disorder of kidney and ureter, unspecified: Secondary | ICD-10-CM | POA: Diagnosis not present

## 2024-05-26 DIAGNOSIS — T402X5A Adverse effect of other opioids, initial encounter: Secondary | ICD-10-CM | POA: Diagnosis not present

## 2024-05-26 DIAGNOSIS — M545 Low back pain, unspecified: Secondary | ICD-10-CM | POA: Insufficient documentation

## 2024-05-26 DIAGNOSIS — N179 Acute kidney failure, unspecified: Secondary | ICD-10-CM | POA: Diagnosis not present

## 2024-05-26 DIAGNOSIS — W14XXXA Fall from tree, initial encounter: Secondary | ICD-10-CM | POA: Diagnosis not present

## 2024-05-26 DIAGNOSIS — S32010A Wedge compression fracture of first lumbar vertebra, initial encounter for closed fracture: Secondary | ICD-10-CM | POA: Diagnosis not present

## 2024-05-26 DIAGNOSIS — R58 Hemorrhage, not elsewhere classified: Secondary | ICD-10-CM | POA: Diagnosis not present

## 2024-05-26 DIAGNOSIS — I10 Essential (primary) hypertension: Secondary | ICD-10-CM | POA: Diagnosis not present

## 2024-05-26 DIAGNOSIS — Z8546 Personal history of malignant neoplasm of prostate: Secondary | ICD-10-CM | POA: Insufficient documentation

## 2024-05-26 DIAGNOSIS — Z043 Encounter for examination and observation following other accident: Secondary | ICD-10-CM | POA: Diagnosis not present

## 2024-05-26 DIAGNOSIS — F32A Depression, unspecified: Secondary | ICD-10-CM | POA: Diagnosis not present

## 2024-05-26 DIAGNOSIS — S32018A Other fracture of first lumbar vertebra, initial encounter for closed fracture: Secondary | ICD-10-CM | POA: Diagnosis not present

## 2024-05-26 DIAGNOSIS — S0990XA Unspecified injury of head, initial encounter: Secondary | ICD-10-CM | POA: Diagnosis not present

## 2024-05-26 LAB — I-STAT CHEM 8, ED
BUN: 24 mg/dL — ABNORMAL HIGH (ref 8–23)
Calcium, Ion: 1.19 mmol/L (ref 1.15–1.40)
Chloride: 101 mmol/L (ref 98–111)
Creatinine, Ser: 1.4 mg/dL — ABNORMAL HIGH (ref 0.61–1.24)
Glucose, Bld: 108 mg/dL — ABNORMAL HIGH (ref 70–99)
HCT: 44 % (ref 39.0–52.0)
Hemoglobin: 15 g/dL (ref 13.0–17.0)
Potassium: 4 mmol/L (ref 3.5–5.1)
Sodium: 139 mmol/L (ref 135–145)
TCO2: 27 mmol/L (ref 22–32)

## 2024-05-26 LAB — CBC
HCT: 44.2 % (ref 39.0–52.0)
Hemoglobin: 15.6 g/dL (ref 13.0–17.0)
MCH: 32.6 pg (ref 26.0–34.0)
MCHC: 35.3 g/dL (ref 30.0–36.0)
MCV: 92.5 fL (ref 80.0–100.0)
Platelets: 188 K/uL (ref 150–400)
RBC: 4.78 MIL/uL (ref 4.22–5.81)
RDW: 11.8 % (ref 11.5–15.5)
WBC: 7.4 K/uL (ref 4.0–10.5)
nRBC: 0 % (ref 0.0–0.2)

## 2024-05-26 LAB — URINALYSIS, ROUTINE W REFLEX MICROSCOPIC
Bilirubin Urine: NEGATIVE
Glucose, UA: NEGATIVE mg/dL
Hgb urine dipstick: NEGATIVE
Ketones, ur: NEGATIVE mg/dL
Leukocytes,Ua: NEGATIVE
Nitrite: NEGATIVE
Protein, ur: NEGATIVE mg/dL
Specific Gravity, Urine: 1.041 — ABNORMAL HIGH (ref 1.005–1.030)
pH: 6 (ref 5.0–8.0)

## 2024-05-26 LAB — COMPREHENSIVE METABOLIC PANEL WITH GFR
ALT: 15 U/L (ref 0–44)
AST: 25 U/L (ref 15–41)
Albumin: 4 g/dL (ref 3.5–5.0)
Alkaline Phosphatase: 47 U/L (ref 38–126)
Anion gap: 10 (ref 5–15)
BUN: 23 mg/dL (ref 8–23)
CO2: 25 mmol/L (ref 22–32)
Calcium: 9.1 mg/dL (ref 8.9–10.3)
Chloride: 101 mmol/L (ref 98–111)
Creatinine, Ser: 1.4 mg/dL — ABNORMAL HIGH (ref 0.61–1.24)
GFR, Estimated: 54 mL/min — ABNORMAL LOW (ref >60)
Glucose, Bld: 108 mg/dL — ABNORMAL HIGH (ref 70–99)
Potassium: 4.1 mmol/L (ref 3.5–5.1)
Sodium: 136 mmol/L (ref 135–145)
Total Bilirubin: 0.9 mg/dL (ref 0.0–1.2)
Total Protein: 6.8 g/dL (ref 6.5–8.1)

## 2024-05-26 LAB — I-STAT CG4 LACTIC ACID, ED: Lactic Acid, Venous: 1.1 mmol/L (ref 0.5–1.9)

## 2024-05-26 MED ORDER — SENNOSIDES-DOCUSATE SODIUM 8.6-50 MG PO TABS
1.0000 | ORAL_TABLET | Freq: Every evening | ORAL | Status: DC | PRN
  Administered 2024-05-27 – 2024-05-28 (×2): 1 via ORAL
  Filled 2024-05-26 (×2): qty 1

## 2024-05-26 MED ORDER — FENTANYL CITRATE PF 50 MCG/ML IJ SOSY
50.0000 ug | PREFILLED_SYRINGE | Freq: Once | INTRAMUSCULAR | Status: AC
  Administered 2024-05-26: 50 ug via INTRAVENOUS
  Filled 2024-05-26: qty 1

## 2024-05-26 MED ORDER — HYDROCODONE-ACETAMINOPHEN 5-325 MG PO TABS
1.0000 | ORAL_TABLET | ORAL | Status: DC | PRN
  Filled 2024-05-26: qty 1

## 2024-05-26 MED ORDER — ACETAMINOPHEN 325 MG PO TABS: 650.0000 mg | ORAL_TABLET | Freq: Four times a day (QID) | ORAL | Status: DC | PRN

## 2024-05-26 MED ORDER — ONDANSETRON HCL 4 MG/2ML IJ SOLN
4.0000 mg | Freq: Four times a day (QID) | INTRAMUSCULAR | Status: DC | PRN
  Administered 2024-05-27: 4 mg via INTRAVENOUS
  Filled 2024-05-26: qty 2

## 2024-05-26 MED ORDER — HYDROMORPHONE HCL 1 MG/ML IJ SOLN
0.5000 mg | INTRAMUSCULAR | Status: DC | PRN
  Administered 2024-05-27: 1 mg via INTRAVENOUS
  Filled 2024-05-26: qty 1

## 2024-05-26 MED ORDER — LACTATED RINGERS IV BOLUS
500.0000 mL | Freq: Once | INTRAVENOUS | Status: AC
  Administered 2024-05-27: 500 mL via INTRAVENOUS

## 2024-05-26 MED ORDER — ENOXAPARIN SODIUM 40 MG/0.4ML IJ SOSY
40.0000 mg | PREFILLED_SYRINGE | INTRAMUSCULAR | Status: DC
  Administered 2024-05-27 – 2024-05-31 (×5): 40 mg via SUBCUTANEOUS
  Filled 2024-05-26 (×5): qty 0.4

## 2024-05-26 MED ORDER — ONDANSETRON HCL 4 MG PO TABS: 4.0000 mg | ORAL_TABLET | Freq: Four times a day (QID) | ORAL | Status: DC | PRN

## 2024-05-26 MED ORDER — IOHEXOL 350 MG/ML SOLN
65.0000 mL | Freq: Once | INTRAVENOUS | Status: AC | PRN
  Administered 2024-05-26: 65 mL via INTRAVENOUS

## 2024-05-26 MED ORDER — BUPROPION HCL ER (XL) 150 MG PO TB24
300.0000 mg | ORAL_TABLET | Freq: Every day | ORAL | Status: DC
  Administered 2024-05-27 – 2024-06-01 (×6): 300 mg via ORAL
  Filled 2024-05-26 (×6): qty 2

## 2024-05-26 MED ORDER — ACETAMINOPHEN 650 MG RE SUPP: 650.0000 mg | Freq: Four times a day (QID) | RECTAL | Status: DC | PRN

## 2024-05-26 NOTE — ED Notes (Signed)
 CCMD contacted to monitor pt

## 2024-05-26 NOTE — ED Provider Notes (Signed)
 [place] EMERGENCY DEPARTMENT AT [facility] Provider Note   CSN: [id number] Arrival date & time: 05/26/24  1650     Patient presents with: Fall from 8 feet from treehouse.   Dwayne Cross is a 73 y.o. male.   73 year old male presenting to the emergency department for evaluation after a fall from a tree house approximately 8 feet.  He reports falling onto his back but denies any loss of consciousness.  He denies any his head and states he is not currently taking any blood thinners.  He reports back pain but denies any chest pain, abdominal pain, or neck pain.  He denies any numbness or tingling in his lower extremities.  He denies any saddle anesthesia.  GCS 15  The history is provided by the patient and the spouse.       Prior to Admission medications   Medication Sig Start Date End Date Taking? Authorizing Provider  buPROPion (WELLBUTRIN XL) 300 MG 24 hr tablet Take 300 mg by mouth daily.    [provider]  fluticasone (FLONASE) 50 MCG/ACT nasal spray Place into both nostrils daily.    [provider]  ondansetron  (ZOFRAN ) 4 MG tablet Take 1 tablet (4 mg total) by mouth every 6 (six) hours. 10/09/19   Laurice Maude BROCKS, MD  oxyCODONE -acetaminophen  (PERCOCET/ROXICET) 5-325 MG tablet Take 1 tablet by mouth every 4 (four) hours as needed for severe pain. 07/14/19   Harden Jerona GAILS, MD  valsartan-hydrochlorothiazide (DIOVAN-HCT) 160-12.5 MG tablet Take 1 tablet by mouth daily.    [provider]    Allergies: Morphine and codeine    Review of Systems  All other systems reviewed and are negative.   Updated Vital Signs BP 132/83   Pulse 85   Temp 98 F (36.7 C) (Oral)   Resp 13   SpO2 94%   Physical Exam Vitals and nursing note reviewed.  Constitutional:      General: He is in acute distress.  HENT:     Head: Atraumatic.     Mouth/Throat:     Mouth: Mucous membranes are moist.  Eyes:     Extraocular Movements: Extraocular  movements intact.     Conjunctiva/sclera: Conjunctivae normal.     Pupils: Pupils are equal, round, and reactive to light.  Neck:     Comments: C collar in place Cardiovascular:     Rate and Rhythm: Normal rate.     Pulses: Normal pulses.  Pulmonary:     Effort: Pulmonary effort is normal.     Breath sounds: Normal breath sounds.  Abdominal:     Palpations: Abdomen is soft.     Tenderness: There is no abdominal tenderness. There is no guarding.  Musculoskeletal:        General: Normal range of motion.     Cervical back: No tenderness.     Lumbar back: Bony tenderness present.  Skin:    General: Skin is warm.     Capillary Refill: Capillary refill takes less than 2 seconds.  Neurological:     Mental Status: He is alert and oriented to person, place, and time.     Sensory: No sensory deficit.     Motor: No weakness.     (all labs ordered are listed, but only abnormal results are displayed) Labs Reviewed  COMPREHENSIVE METABOLIC PANEL WITH GFR - Abnormal; Notable for the following components:      Result Value   Glucose, Bld 108 (*)  Creatinine, Ser 1.40 (*)    GFR, Estimated 54 (*)    All other components within normal limits  URINALYSIS, ROUTINE W REFLEX MICROSCOPIC - Abnormal; Notable for the following components:   Specific Gravity, Urine 1.041 (*)    All other components within normal limits  I-STAT CHEM 8, ED - Abnormal; Notable for the following components:   BUN 24 (*)    Creatinine, Ser 1.40 (*)    Glucose, Bld 108 (*)    All other components within normal limits  CBC  I-STAT CG4 LACTIC ACID, ED    EKG: None  Radiology: CT T-SPINE NO CHARGE Result Date: 05/26/2024 CLINICAL DATA:  Trauma fall from 8 feet EXAM: CT Thoracic and Lumbar spine without contrast TECHNIQUE: Multiplanar CT images of the thoracic and lumbar spine were reconstructed from contemporary CT of the Chest, Abdomen, and Pelvis. RADIATION DOSE REDUCTION: This exam was performed according to  the departmental dose-optimization program which includes automated exposure control, adjustment of the mA and/or kV according to patient size and/or use of iterative reconstruction technique. CONTRAST:  None or No additional COMPARISON:  Same day CT 05/26/2024 FINDINGS: CT THORACIC SPINE FINDINGS Alignment: Within normal limits. Vertebrae: Mild superior endplate deformity at T12 with Schmorl's node, probably chronic. Vertebral body heights are maintained. Paraspinal and other soft tissues: No acute finding Disc levels: Multilevel degenerative osteophytes. No abnormal disc space widening. CT LUMBAR SPINE FINDINGS Segmentation: 5 lumbar type vertebrae. Alignment: Normal. Vertebrae: Acute mildly comminuted fracture involves the L1 vertebral body, including the superior endplate. Approaching 50% loss of vertebral body height anteriorly. Mild 2 mm retropulsion of the posterior vertebral body, without significant canal stenosis. Fracture does not appear to involve the pedicles. Partial midline cleft at the spinous process of L1. Old appearing deformities involving the right transverse process of L2, L3 and L4. Remaining vertebra demonstrate normal stature. Schmorl's node superior endplate L3. Paraspinal and other soft tissues: Paravertebral soft tissue swelling greatest at L1 level. Disc levels: Mild disc space narrowing at L1-L2 with suspected disc bulge. Disc bulges at L3-L4 and L4-L5. Suspect at least mild canal stenosis at L4-L5. Mild multilevel facet degenerative change. IMPRESSION: 1. Acute mildly comminuted moderate compression fracture involving the L1 vertebral body with close to 50% loss of vertebral body height anteriorly. Mild 2 mm retropulsion of the posterior vertebral body without significant canal stenosis. 2. Mild superior endplate deformity at T12 with Schmorl's node, probably chronic. 3. Old appearing deformities involving the right transverse process of L2, L3 and L4. 4. Multilevel degenerative  changes of the thoracic and lumbar spine. 5. No definite acute osseous abnormality of the thoracic spine Electronically Signed   By: Luke Bun M.D.   On: 05/26/2024 18:44   CT L-SPINE NO CHARGE Result Date: 05/26/2024 CLINICAL DATA:  Trauma fall from 8 feet EXAM: CT Thoracic and Lumbar spine without contrast TECHNIQUE: Multiplanar CT images of the thoracic and lumbar spine were reconstructed from contemporary CT of the Chest, Abdomen, and Pelvis. RADIATION DOSE REDUCTION: This exam was performed according to the departmental dose-optimization program which includes automated exposure control, adjustment of the mA and/or kV according to patient size and/or use of iterative reconstruction technique. CONTRAST:  None or No additional COMPARISON:  Same day CT 05/26/2024 FINDINGS: CT THORACIC SPINE FINDINGS Alignment: Within normal limits. Vertebrae: Mild superior endplate deformity at T12 with Schmorl's node, probably chronic. Vertebral body heights are maintained. Paraspinal and other soft tissues: No acute finding Disc levels: Multilevel degenerative osteophytes. No abnormal disc  space widening. CT LUMBAR SPINE FINDINGS Segmentation: 5 lumbar type vertebrae. Alignment: Normal. Vertebrae: Acute mildly comminuted fracture involves the L1 vertebral body, including the superior endplate. Approaching 50% loss of vertebral body height anteriorly. Mild 2 mm retropulsion of the posterior vertebral body, without significant canal stenosis. Fracture does not appear to involve the pedicles. Partial midline cleft at the spinous process of L1. Old appearing deformities involving the right transverse process of L2, L3 and L4. Remaining vertebra demonstrate normal stature. Schmorl's node superior endplate L3. Paraspinal and other soft tissues: Paravertebral soft tissue swelling greatest at L1 level. Disc levels: Mild disc space narrowing at L1-L2 with suspected disc bulge. Disc bulges at L3-L4 and L4-L5. Suspect at least mild  canal stenosis at L4-L5. Mild multilevel facet degenerative change. IMPRESSION: 1. Acute mildly comminuted moderate compression fracture involving the L1 vertebral body with close to 50% loss of vertebral body height anteriorly. Mild 2 mm retropulsion of the posterior vertebral body without significant canal stenosis. 2. Mild superior endplate deformity at T12 with Schmorl's node, probably chronic. 3. Old appearing deformities involving the right transverse process of L2, L3 and L4. 4. Multilevel degenerative changes of the thoracic and lumbar spine. 5. No definite acute osseous abnormality of the thoracic spine Electronically Signed   By: Luke Bun M.D.   On: 05/26/2024 18:44   CT CHEST ABDOMEN PELVIS W CONTRAST Result Date: 05/26/2024 CLINICAL DATA:  [place] out of tree house EXAM: CT CHEST, ABDOMEN, AND PELVIS WITH CONTRAST TECHNIQUE: Multidetector CT imaging of the chest, abdomen and pelvis was performed following the standard protocol during bolus administration of intravenous contrast. RADIATION DOSE REDUCTION: This exam was performed according to the departmental dose-optimization program which includes automated exposure control, adjustment of the mA and/or kV according to patient size and/or use of iterative reconstruction technique. CONTRAST:  65mL OMNIPAQUE IOHEXOL 350 MG/ML SOLN COMPARISON:  Radiograph 05/26/2024 FINDINGS: CT CHEST FINDINGS Cardiovascular: Mild aortic atherosclerosis. No aneurysm. Normal aortic contour. Multi-vessel coronary vascular calcification. Normal cardiac size. No pericardial effusion Mediastinum/Nodes: Patent trachea. Left thyroid  nodule measuring 17 mm. No suspicious lymph nodes. Esophagus within normal limits Lungs/Pleura: Negative for pleural effusion or pneumothorax. Hazy areas of probable dependent atelectasis. No focal airspace disease Musculoskeletal: Sternum appears intact. See separately dictated thoracic CT CT ABDOMEN PELVIS FINDINGS Hepatobiliary: No focal liver  abnormality is seen. No gallstones, gallbladder wall thickening, or biliary dilatation. Pancreas: Unremarkable. No pancreatic ductal dilatation or surrounding inflammatory changes. Spleen: Normal in size without focal abnormality. Adrenals/Urinary Tract: Adrenal glands are unremarkable. Kidneys are normal, without renal calculi, focal lesion, or hydronephrosis. Bladder is unremarkable. Stomach/Bowel: Stomach is within normal limits. No evidence of bowel wall thickening, distention, or inflammatory changes. Vascular/Lymphatic: Aortic atherosclerosis. No enlarged abdominal or pelvic lymph nodes. Reproductive: Prostate is slightly enlarged Other: Negative for pelvic effusion or free air. Small fat containing left inguinal hernia Musculoskeletal: Old right inferior pubic ramus fracture. Remote right acetabular fracture. See separately dictated lumbar CT IMPRESSION: 1. No CT evidence for acute intrathoracic, intra-abdominal, or intrapelvic abnormality. See separately dictated spine CT for additional findings 2. 17 mm left thyroid  nodule. Recommend thyroid  US  (ref: J Am Coll Radiol. 2015 Feb;12(2): 143-50).This should be performed non emergently 3. Aortic atherosclerosis. Aortic Atherosclerosis (ICD10-I70.0). Electronically Signed   By: Luke Bun M.D.   On: 05/26/2024 18:31   CT HEAD WO CONTRAST Result Date: 05/26/2024 CLINICAL DATA:  Fall 8 feet from tree house with head/neck trauma. EXAM: CT HEAD WITHOUT CONTRAST CT CERVICAL SPINE WITHOUT CONTRAST  TECHNIQUE: Multidetector CT imaging of the head and cervical spine was performed following the standard protocol without intravenous contrast. Multiplanar CT image reconstructions of the cervical spine were also generated. RADIATION DOSE REDUCTION: This exam was performed according to the departmental dose-optimization program which includes automated exposure control, adjustment of the mA and/or kV according to patient size and/or use of iterative reconstruction  technique. COMPARISON:  Head CT 10/09/2019 FINDINGS: CT HEAD FINDINGS Brain: Ventricles, cisterns and other CSF spaces are normal. No mass, mass effect, shift of midline structures or acute hemorrhage. Remainder the exam is unchanged. Vascular: No hyperdense vessel or unexpected calcification. Skull: No acute fracture. Sinuses/Orbits: Orbits are normal. Air-fluid level over the left maxillary sinus. Minimal opacification over the ethmoid air cells. Other: None. CT CERVICAL SPINE FINDINGS Alignment: Normal. Skull base and vertebrae: Moderate spondylosis of the cervical spine to include uncovertebral joint spurring and facet arthropathy. Vertebral body heights are maintained. No acute fracture. Minimal bilateral neural foraminal narrowing at the C5-6 level and C6-7 levels. Soft tissues and spinal canal: No prevertebral fluid or swelling. No visible canal hematoma. Disc levels: Disc space narrowing at the C5-6 and C6-7 levels as well as the C7-T1 level. Upper chest: No acute findings. Other: None. IMPRESSION: 1. No acute brain injury. 2. No acute cervical spine injury. 3. 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 Minimal bilateral neural foraminal narrowing at the C5-6 and C6-7 levels. Electronically Signed   By: Toribio Agreste M.D.   On: 05/26/2024 18:31   CT CERVICAL SPINE WO CONTRAST Result Date: 05/26/2024 CLINICAL DATA:  Fall 8 feet from tree house with head/neck trauma. EXAM: CT HEAD WITHOUT CONTRAST CT CERVICAL SPINE WITHOUT CONTRAST TECHNIQUE: Multidetector CT imaging of the head and cervical spine was performed following the standard protocol without intravenous contrast. Multiplanar CT image reconstructions of the cervical spine were also generated. RADIATION DOSE REDUCTION: This exam was performed according to the departmental dose-optimization program which includes automated exposure control, adjustment of the mA and/or kV according to patient size  and/or use of iterative reconstruction technique. COMPARISON:  Head CT 10/09/2019 FINDINGS: CT HEAD FINDINGS Brain: Ventricles, cisterns and other CSF spaces are normal. No mass, mass effect, shift of midline structures or acute hemorrhage. Remainder the exam is unchanged. Vascular: No hyperdense vessel or unexpected calcification. Skull: No acute fracture. Sinuses/Orbits: Orbits are normal. Air-fluid level over the left maxillary sinus. Minimal opacification over the ethmoid air cells. Other: None. CT CERVICAL SPINE FINDINGS Alignment: Normal. Skull base and vertebrae: Moderate spondylosis of the cervical spine to include uncovertebral joint spurring and facet arthropathy. Vertebral body heights are maintained. No acute fracture. Minimal bilateral neural foraminal narrowing at the C5-6 level and C6-7 levels. Soft tissues and spinal canal: No prevertebral fluid or swelling. No visible canal hematoma. Disc levels: Disc space narrowing at the C5-6 and C6-7 levels as well as the C7-T1 level. Upper chest: No acute findings. Other: None. IMPRESSION: 1. No acute brain injury. 2. No acute cervical spine injury. 3. 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 Minimal bilateral neural foraminal narrowing at the C5-6 and C6-7 levels. Electronically Signed   By: Toribio Agreste M.D.   On: 05/26/2024 18:31   DG Pelvis Portable Result Date: 05/26/2024 CLINICAL DATA:  Fall. EXAM: PORTABLE PELVIS 1-2 VIEWS COMPARISON:  None Available. FINDINGS: There is no evidence of pelvic fracture or diastasis. No pelvic bone lesions are  seen. IMPRESSION: Negative. Electronically Signed   By: Lynwood Landy Raddle M.D.   On: 05/26/2024 17:33   DG Chest Port 1 View Result Date: 05/26/2024 CLINICAL DATA:  Fall. EXAM: PORTABLE CHEST 1 VIEW COMPARISON:  October 28, 2023. FINDINGS: Stable cardiomediastinal silhouette. Both lungs are clear. The visualized skeletal structures are unremarkable. IMPRESSION:  No active disease. Electronically Signed   By: Lynwood Landy Raddle M.D.   On: 05/26/2024 17:32     Procedures   Medications Ordered in the ED  fentaNYL  (SUBLIMAZE ) injection 50 mcg (has no administration in time range)  fentaNYL  (SUBLIMAZE ) injection 50 mcg (50 mcg Intravenous Given 05/26/24 1712)  iohexol (OMNIPAQUE) 350 MG/ML injection 65 mL (65 mLs Intravenous Contrast Given 05/26/24 1815)  fentaNYL  (SUBLIMAZE ) injection 50 mcg (50 mcg Intravenous Given 05/26/24 2029)    Clinical Course as of 05/26/24 2156  Tue May 26, 2024  1934  Acute mildly comminuted moderate compression fracture involving the L1 vertebral body with close to 50% loss of vertebral body height anteriorly. Mild 2 mm retropulsion of the posterior vertebral body without significant canal stenosis.   [AL]    Clinical Course User Index [AL] Carnita Golob, DO                                 Medical Decision Making 73 year old male brought in for back pain after an 8 foot fall out of a tree house.  Patient's vitals are stable and the only abnormality appreciated on physical exam was back pain to palpation.  He is neurologically intact in his lower extremities.  Normal neuroexam and GCS 15.  Differential includes but not limited to lumbar fracture, thoracic fracture, pelvic fracture, head injury, and other traumatic injuries.  Trauma evaluation including lab work and imaging ordered to rule out any acute process. Chest and pelvis x-rays were unremarkable for any acute abnormalities. We performed CT of the head, cervical spine, chest/abdomen/pelvis with contrast, lumbar spine, and thoracic spine.  The only traumatic injury identified was an L1 vertebral body compression fracture with 50% height loss.  Lab work showing slight increase in his creatinine from baseline.  Pain is controlled with fentanyl .  Neurosurgery consulted regarding the traumatic spinal fracture.  Since he is neurologically intact, he can be placed in a back  brace and follow-up in the outpatient setting.  The brace is ordered and the orthopedic tech will apply.  We will then proceed with an ambulatory trial.  Ambulatory trial unsuccessful and patient stating he does not want to go home to sleep at night with this amount of pain. We will proceed with hospital admission for pain control and PT/OT.  We will go ahead and talk to the hospitalist since there are no surgical interventions from neurosurgery at this time.  Amount and/or Complexity of Data Reviewed Labs: ordered. Radiology: ordered.  Risk Prescription drug management.     Final diagnoses:  Fall, initial encounter  Closed compression fracture of body of L1 vertebra [facility])    ED Discharge Orders     None          Dwayne Torosyan, DO 05/26/24 2156

## 2024-05-26 NOTE — ED Notes (Signed)
 [employer] notified for TLSO brace.

## 2024-05-26 NOTE — ED Triage Notes (Signed)
 PT BIB [facility].  Pt was standing in treehouse and floor gave way fell approx. 8 feet.  Complaining of C-spine, lumbar, and sacral pain, BL hip pain. Hx of hip fx in 2008 w/o replacement. No LOC, no thinners.   158/110 97% RA, CBG 108,   650 tylenol  PO

## 2024-05-26 NOTE — H&P (Signed)
 History and Physical    Dwayne Cross FMW:[id number] DOB: 07-30-1951 DOA: 05/26/2024  PCP: Seabron Lenis, MD  Patient coming from: Home  I have personally briefly reviewed patient's old medical records in [facility]  Chief Complaint: Back pain after fall  HPI: Dwayne Cross is a 73 y.o. male with medical history significant for HTN, prostate cancer who presented to the ED for evaluation of back pain after a fall.  Patient states he was working on a tree house at home when he fell through the trapdoor.  He fell from height of about 8 feet.  He landed directly on his back and had immediate pain.  He did not hit his head or lose consciousness.  He says pain is localized to his lower back right above his buttocks.  He has not had any new weakness in his lower extremities.  No numbness/tingling radiating down his legs.  ED Course  Labs/Imaging on admission: I have personally reviewed following labs and imaging studies.  Initial vitals showed BP 170/113, pulse 79, RR 15, temp 98.0 F, SpO2 98% on room air.  Labs showed WBC 7.4, hemoglobin 15.6, platelets 188, sodium 136, potassium 4.1, bicarb 25, BUN 23, creatinine 1.40, serum glucose 108, LFTs within normal limits, lactic acid 1.1.  CT head without contrast negative for acute brain injury.  CT cervical spine without contrast negative for acute C-spine injury.  Moderate spondylosis with disc disease at C5-6, C6-7, C7-T1 noted.  CT chest/abdomen/pelvis with contrast negative for acute abnormality.  17 mm left thyroid  nodule noted.  CT thoracic and lumbar spine showed an acute mildly comminuted moderate compression fracture involving the L1 vertebral body with close to 50% loss of vertebral body height anteriorly.  Mild 2 mm retropulsion of posterior vertebral body without significant canal stenosis.  Mild superior endplate deformity at T12 with Schmorl's node.  Old appearing deformities involving the right transverse process of  L2-L4.  Patient was given IV fentanyl  50 mcg x 3.  EDP discussed with on-call for neurosurgery who recommended TLSO brace and outpatient follow-up.  Pain has been well-controlled and patient was unable to ambulate safely and therefore the hospitalist service was consulted for admission.  Review of Systems: All systems reviewed and are negative except as documented in history of present illness above.   Past Medical History:  Diagnosis Date   Depression    Hypertension    MMT (medial meniscus tear)    left knee    Past Surgical History:  Procedure Laterality Date   EYE SURGERY     as child   HAND SURGERY Right    KNEE ARTHROSCOPY WITH MEDIAL MENISECTOMY Left 07/14/2019   Procedure: LEFT KNEE ARTHROSCOPY AND DEBRIDEMENT;  Surgeon: Harden Jerona GAILS, MD;  Location: [facility];  Service: Orthopedics;  Laterality: Left;    Social History: Social History   Tobacco Use   Smoking status: Never   Smokeless tobacco: Never  Vaping Use   Vaping status: Never Used  Substance Use Topics   Alcohol use: Never   Drug use: Never   Allergies  Allergen Reactions   Morphine And Codeine Other (See Comments)    Anxious, figity    Family History  Problem Relation Age of Onset   Hypertension Mother    Hypertension Father    Heart failure Father    Cancer Father      Prior to Admission medications   Medication Sig Start Date End Date Taking? Authorizing Provider  buPROPion (  WELLBUTRIN XL) 300 MG 24 hr tablet Take 300 mg by mouth daily.    [provider]  fluticasone (FLONASE) 50 MCG/ACT nasal spray Place into both nostrils daily.    [provider]  ondansetron  (ZOFRAN ) 4 MG tablet Take 1 tablet (4 mg total) by mouth every 6 (six) hours. 10/09/19   Laurice Maude BROCKS, MD  oxyCODONE -acetaminophen  (PERCOCET/ROXICET) 5-325 MG tablet Take 1 tablet by mouth every 4 (four) hours as needed for severe pain. 07/14/19   Harden Jerona GAILS, MD   valsartan-hydrochlorothiazide (DIOVAN-HCT) 160-12.5 MG tablet Take 1 tablet by mouth daily.    [provider]    Physical Exam: Vitals:   05/26/24 2255 05/26/24 2300 05/26/24 2301 05/26/24 2305  BP:  (!) 135/97    Pulse: 79 81  80  Resp: 15 18  16   Temp:   98.2 F (36.8 C)   TempSrc:   Oral   SpO2: 94% 95%  96%  Weight:    86.6 kg  Height:    5' 7.5 (1.715 m)   Constitutional: Resting supine in bed, NAD, calm, comfortable Eyes: EOMI, lids and conjunctivae normal ENMT: Mucous membranes are moist. Posterior pharynx clear of any exudate or lesions.Normal dentition.  Neck: normal, supple, no masses. Respiratory: clear to auscultation bilaterally, no wheezing, no crackles. Normal respiratory effort. No accessory muscle use.  Cardiovascular: Regular rate and rhythm, no murmurs / rubs / gallops. No extremity edema. 2+ pedal pulses. Abdomen: no tenderness, no masses palpated. Musculoskeletal: TLSO brace in place.  Good ROM of all extremities. Skin: no rashes, lesions, ulcers. No induration Neurologic: Sensation intact. Strength 5/5 in all 4.  Psychiatric: Normal judgment and insight. Alert and oriented x 3. Normal mood.   EKG: Not performed.  Assessment/Plan Principal Problem:   Closed compression fracture of L1 lumbar vertebra, initial encounter (HCC) Active Problems:   Left thyroid  nodule   Hypertension   Depression   Dwayne Cross is a 73 y.o. male with medical history significant for HTN, prostate cancer who is admitted with an acute L1 vertebral compression fracture after a fall at home.  Assessment and Plan: Acute mildly comminuted L1 vertebral body compression fracture: Occurring after falling approximately 8 feet when the floor gave away in the tree house he was standing in.  Approximately 50% vertebral body height loss noted on CT.  EDP spoke with neurosurgery who recommended TLSO and outpatient follow-up.  Pain has been uncontrolled and patient unable to  ambulate safely therefore admission was requested. - TLSO brace in place - Continue analgesics as needed - PT/OT eval, fall precautions - Follow-up with neurosurgery as an outpatient  Mild renal insufficiency: Creatinine 1.40 on admission, unclear if baseline CKD versus acute change.  Will give 500 LR bolus, hold ARB, and repeat labs in AM.  Hypertension: BP is stable, holding valsartan.  Prostate cancer: Patient is scheduled for prostatectomy on 07/02/2024 with Dr. Renda.  Depression: Continue Wellbutrin.  Left thyroid  nodule: A 17 mm left thyroid  nodule was noted on CT imaging.  Follow-up outpatient ultrasound recommended.  Findings discussed with patient.   DVT prophylaxis: enoxaparin (LOVENOX) injection 40 mg Start: 05/27/24 1000 Code Status: Full code, confirmed with patient on admission Family Communication: Spouse at bedside Disposition Plan: From home, dispo pending clinical progress Consults called: EDP discussed with neurosurgery Severity of Illness: The appropriate patient status for this patient is OBSERVATION. Observation status is judged to be reasonable and necessary in order to provide the required intensity of service to  ensure the patient's safety. The patient's presenting symptoms, physical exam findings, and initial radiographic and laboratory data in the context of their medical condition is felt to place them at decreased risk for further clinical deterioration. Furthermore, it is anticipated that the patient will be medically stable for discharge from the hospital within 2 midnights of admission.   Jorie Blanch MD [facility]  If 7PM-7AM, please contact night-coverage [email or web address]  05/27/2024, 12:04 AM

## 2024-05-26 NOTE — Progress Notes (Signed)
 Orthopedic Tech Progress Note Patient Details:  Dwayne Cross [birth year] [id number] Applied TLSO per order.  Ortho Devices Type of Ortho Device: Thoracolumbar corset (TLSO) Ortho Device/Splint Interventions: Ordered, Application, Adjustment   Post Interventions Patient Tolerated: Fair Instructions Provided: Adjustment of device, Care of device, Poper ambulation with device  [name] 05/26/2024, 9:59 PM

## 2024-05-26 NOTE — ED Notes (Signed)
Ortho Tech at bedside.  

## 2024-05-27 ENCOUNTER — Encounter (HOSPITAL_COMMUNITY): Payer: Self-pay | Admitting: Internal Medicine

## 2024-05-27 ENCOUNTER — Other Ambulatory Visit: Payer: Self-pay

## 2024-05-27 DIAGNOSIS — W1789XA Other fall from one level to another, initial encounter: Secondary | ICD-10-CM | POA: Diagnosis not present

## 2024-05-27 DIAGNOSIS — S32010A Wedge compression fracture of first lumbar vertebra, initial encounter for closed fracture: Secondary | ICD-10-CM | POA: Diagnosis not present

## 2024-05-27 LAB — BASIC METABOLIC PANEL WITH GFR
Anion gap: 10 (ref 5–15)
BUN: 20 mg/dL (ref 8–23)
CO2: 25 mmol/L (ref 22–32)
Calcium: 8.7 mg/dL — ABNORMAL LOW (ref 8.9–10.3)
Chloride: 101 mmol/L (ref 98–111)
Creatinine, Ser: 1.06 mg/dL (ref 0.61–1.24)
GFR, Estimated: >60 mL/min (ref >60)
Glucose, Bld: 121 mg/dL — ABNORMAL HIGH (ref 70–99)
Potassium: 3.8 mmol/L (ref 3.5–5.1)
Sodium: 136 mmol/L (ref 135–145)

## 2024-05-27 LAB — CBC
HCT: 42.5 % (ref 39.0–52.0)
Hemoglobin: 15.2 g/dL (ref 13.0–17.0)
MCH: 33 pg (ref 26.0–34.0)
MCHC: 35.8 g/dL (ref 30.0–36.0)
MCV: 92.2 fL (ref 80.0–100.0)
Platelets: 152 K/uL (ref 150–400)
RBC: 4.61 MIL/uL (ref 4.22–5.81)
RDW: 11.9 % (ref 11.5–15.5)
WBC: 8.1 K/uL (ref 4.0–10.5)
nRBC: 0 % (ref 0.0–0.2)

## 2024-05-27 LAB — HIV ANTIBODY (ROUTINE TESTING W REFLEX): HIV Screen 4th Generation wRfx: NONREACTIVE

## 2024-05-27 MED ORDER — METHOCARBAMOL 1000 MG/10ML IJ SOLN
500.0000 mg | Freq: Four times a day (QID) | INTRAMUSCULAR | Status: DC | PRN
  Administered 2024-05-27 – 2024-05-28 (×3): 500 mg via INTRAVENOUS
  Filled 2024-05-27 (×3): qty 10

## 2024-05-27 MED ORDER — INFLUENZA VAC SPLIT HIGH-DOSE 0.5 ML IM SUSY: 0.5000 mL | PREFILLED_SYRINGE | INTRAMUSCULAR | Status: DC | PRN

## 2024-05-27 MED ORDER — HYDROCHLOROTHIAZIDE 12.5 MG PO TABS
12.5000 mg | ORAL_TABLET | Freq: Every day | ORAL | Status: DC
  Administered 2024-05-27 – 2024-06-01 (×6): 12.5 mg via ORAL
  Filled 2024-05-27 (×6): qty 1

## 2024-05-27 MED ORDER — HYDROMORPHONE HCL 1 MG/ML IJ SOLN
0.5000 mg | INTRAMUSCULAR | Status: AC | PRN
  Administered 2024-05-27 (×2): 1 mg via INTRAVENOUS
  Filled 2024-05-27: qty 1

## 2024-05-27 MED ORDER — FLUTICASONE PROPIONATE 50 MCG/ACT NA SUSP
1.0000 | Freq: Every day | NASAL | Status: DC
  Administered 2024-05-29 – 2024-05-30 (×2): 1 via NASAL
  Filled 2024-05-27: qty 16

## 2024-05-27 MED ORDER — ACETAMINOPHEN 650 MG RE SUPP: 650.0000 mg | Freq: Three times a day (TID) | RECTAL | Status: DC

## 2024-05-27 MED ORDER — IRBESARTAN 150 MG PO TABS
150.0000 mg | ORAL_TABLET | Freq: Every day | ORAL | Status: DC
Start: 2024-05-27 — End: 2024-06-01
  Administered 2024-05-28 – 2024-06-01 (×5): 150 mg via ORAL
  Filled 2024-05-27 (×6): qty 1

## 2024-05-27 MED ORDER — OXYCODONE HCL 5 MG PO TABS
5.0000 mg | ORAL_TABLET | ORAL | Status: DC | PRN
  Administered 2024-05-27 – 2024-05-28 (×5): 10 mg via ORAL
  Filled 2024-05-27 (×6): qty 2

## 2024-05-27 MED ORDER — VALSARTAN-HYDROCHLOROTHIAZIDE 160-12.5 MG PO TABS: 1.0000 | ORAL_TABLET | Freq: Every day | ORAL | Status: DC

## 2024-05-27 MED ORDER — ACETAMINOPHEN 500 MG PO TABS
1000.0000 mg | ORAL_TABLET | Freq: Three times a day (TID) | ORAL | Status: DC
  Administered 2024-05-27 – 2024-06-01 (×14): 1000 mg via ORAL
  Filled 2024-05-27 (×14): qty 2

## 2024-05-27 NOTE — Progress Notes (Signed)
 Transition of Care [facility]) - Inpatient Brief Assessment   Patient Details  Name: Dwayne Cross MRN: [id number] Date of Birth: 02/06/1951  Transition of Care [facility]) CM/SW Contact:    Rosaline JONELLE Joe, RN Phone Number: 05/27/2024, 2:52 PM   Clinical Narrative: CM met with the patient and spouse at the bedside to discuss patient's need for [facility] when he is discharged.  Patient was asleep at this time.  Patient's wife states that she works for [employer] as Charity fundraiser and would prefer HH PT through [facility].  [facility] accepted.  HH PT order placed to be co-signed by MD.  DME at the home includes RW, shower seat, 3:1 and shower head.  Backbrace for home is present at the bedside at this time.  No other IP Care management needs.  Patient's wife will provide transportation to home by car when stable.   Transition of Care Asessment: Insurance and Status: (P) Insurance coverage has been reviewed Patient has primary care physician: (P) Yes Home environment has been reviewed: (P) from home with spouse Prior level of function:: (P) RW Prior/Current Home Services: (P) No current home services (DME at the home includes RW, shower seat, 3:1 and shower head) Social Drivers of Health Review: (P) SDOH reviewed interventions complete Readmission risk has been reviewed: (P) Yes Transition of care needs: (P) transition of care needs identified, TOC will continue to follow

## 2024-05-27 NOTE — Care Management Obs Status (Addendum)
 MEDICARE OBSERVATION STATUS NOTIFICATION   Patient Details  Name: Dwayne Cross MRN: [id number] Date of Birth: Jul 31, 1951   Medicare Observation Status Notification Given:  Yes  Verbally reviewed observation notice with Vicenta Albino telephonically at [phone].  Will deliver a copy to the patient room   Jamaree Hosier 05/27/2024, 1:27 PM

## 2024-05-27 NOTE — Evaluation (Signed)
 Physical Therapy Evaluation Patient Details Name: Dwayne Cross MRN: [id number] DOB: 06/12/1951 Today's Date: 05/27/2024  History of Present Illness  73 yo male s/p fall ~ 8 ft with back pain CT (+) compression fx L1  PMH HTN prostate CA  Clinical Impression  Patient presents with decreased mobility due to pain, limited activity tolerance, decreased knowledge of precautions and use of DME.  Previously independent and still working as Occupational psychologist.  Currently needing mod A for bed mobility using bed features, min A for transfers and was able to ambulate in hallway with CGA with RW.  Feel he will benefit from continued skilled PT in the acute setting and from HHPT at d/c.         If plan is discharge home, recommend the following: A little help with walking and/or transfers;Assist for transportation;A little help with bathing/dressing/bathroom;Help with stairs or ramp for entrance   Can travel by private vehicle        Equipment Recommendations None recommended by PT  Recommendations for Other Services       Functional Status Assessment Patient has had a recent decline in their functional status and demonstrates the ability to make significant improvements in function in a reasonable and predictable amount of time.     Precautions / Restrictions Precautions Precautions: Fall;Back Required Braces or Orthoses: Spinal Brace Spinal Brace: Thoracolumbosacral orthotic;Applied in sitting position      Mobility  Bed Mobility Overal bed mobility: Needs Assistance Bed Mobility: Rolling, Sidelying to Sit Rolling: Min assist Sidelying to sit: Mod assist, +2 for safety/equipment, Used rails, HOB elevated       General bed mobility comments: educated spouse how to use RW at side of bed for railing and in option for bed rail that fits under mattress, using rails with cues for back precautions to roll for brace adjustment, removing soiled clothing/linen under pt; side to sit with HOB up  and slowing helping legs to lower off EOB as painful initiallly in flat with moving legs off EOB    Transfers Overall transfer level: Needs assistance Equipment used: Rolling walker (2 wheels) Transfers: Sit to/from Stand Sit to Stand: From elevated surface, Min assist           General transfer comment: cues for hand placement, assist for balance    Ambulation/Gait Ambulation/Gait assistance: Contact guard assist Gait Distance (Feet): 140 Feet Assistive device: Rolling walker (2 wheels) Gait Pattern/deviations: Step-to pattern, Step-through pattern       General Gait Details: using wide RW that was in the room, cue for managing on turns and A for balance, chair follow initially though pt did not need  Stairs            Wheelchair Mobility     Tilt Bed    Modified Rankin (Stroke Patients Only)       Balance Overall balance assessment: Needs assistance   Sitting balance-Leahy Scale: Fair     Standing balance support: Bilateral upper extremity supported Standing balance-Leahy Scale: Poor Standing balance comment: UE support for balance                             Pertinent Vitals/Pain Pain Assessment Pain Assessment: 0-10 Pain Score: 5  Pain Location: lower back to butt Pain Descriptors / Indicators: Aching, Dull Pain Intervention(s): Monitored during session, Limited activity within patient's tolerance    Home Living Family/patient expects to be discharged to:: Private residence Living Arrangements:  Spouse/significant other Available Help at Discharge: Family Type of Home: House Home Access: Stairs to enter   [employer] of Steps: 2 in front no rail (post), 4 in back with rail   Home Layout: Two level;Able to live on main level with bedroom/bathroom Home Equipment: Shower seat;Rolling Walker (2 wheels);BSC/3in1;Grab bars - tub/shower;Hand held shower head Additional Comments: was scheduled for prostate surgery early  November    Prior Function Prior Level of Function : Independent/Modified Independent               ADLs Comments: works as Occupational psychologist in [place]      Extremity/Trunk Assessment   Upper Extremity Assessment Upper Extremity Assessment: Defer to OT evaluation    Lower Extremity Assessment Lower Extremity Assessment: RLE deficits/detail;LLE deficits/detail RLE Deficits / Details: AROM WFL, strength WFL except hip flexion NT due to back pain RLE Sensation: WNL LLE Deficits / Details: AROM WFL, strength WFL except hip flexion NT due to back pain LLE Sensation: WNL    Cervical / Trunk Assessment Cervical / Trunk Assessment: Other exceptions Cervical / Trunk Exceptions: L1 compression fx  Communication   Communication Communication: No apparent difficulties    Cognition Arousal: Alert Behavior During Therapy: WFL for tasks assessed/performed   PT - Cognitive impairments: No apparent impairments                         Following commands: Intact       Cueing Cueing Techniques: Verbal cues     General Comments General comments (skin integrity, edema, etc.): wife in the room and supportive, recent back surgery herself, states plans for prostate surgery early November if able. Demonstrated return to bed technique and initiated education on car transfers    Exercises     Assessment/Plan    PT Assessment Patient needs continued PT services  PT Problem List Decreased strength;Decreased knowledge of use of DME;Decreased activity tolerance;Decreased knowledge of precautions;Decreased balance;Decreased mobility       PT Treatment Interventions DME instruction;Gait training;Stair training;Functional mobility training;Therapeutic activities;Therapeutic exercise;Balance training;Patient/family education    PT Goals (Current goals can be found in the Care Plan section)  Acute Rehab PT Goals Patient Stated Goal: return home, independent PT Goal Formulation:  With patient/family Time For Goal Achievement: 06/10/24 Potential to Achieve Goals: Good    Frequency Min 3X/week     Co-evaluation PT/OT/SLP Co-Evaluation/Treatment: Yes Reason for Co-Treatment: For patient/therapist safety;To address functional/ADL transfers;Other (comment) (pain) PT goals addressed during session: Mobility/safety with mobility;Proper use of DME;Balance         AM-PAC PT 6 Clicks Mobility  Outcome Measure Help needed turning from your back to your side while in a flat bed without using bedrails?: A Little Help needed moving from lying on your back to sitting on the side of a flat bed without using bedrails?: A Lot Help needed moving to and from a bed to a chair (including a wheelchair)?: A Little Help needed standing up from a chair using your arms (e.g., wheelchair or bedside chair)?: A Little Help needed to walk in hospital room?: A Little Help needed climbing 3-5 steps with a railing? : Total 6 Click Score: 15    End of Session Equipment Utilized During Treatment: Gait belt;Back brace Activity Tolerance: Patient tolerated treatment well Patient left: in chair;with call bell/phone within reach;with family/visitor present;with chair alarm set Nurse Communication: Mobility status PT Visit Diagnosis: Other abnormalities of gait and mobility (R26.89);Pain;Difficulty in walking, not elsewhere classified (  R26.2) Pain - part of body:  (back)    Time: 1030-1055 PT Time Calculation (min) (ACUTE ONLY): 25 min   Charges:   PT Evaluation $PT Eval Moderate Complexity: 1 Mod   PT General Charges $$ ACUTE PT VISIT: 1 Visit         [name], PT Acute Rehabilitation Services Office:[phone] 05/27/2024   [name] 05/27/2024, 1:04 PM

## 2024-05-27 NOTE — ED Notes (Signed)
 Per charge RN ok to monitor med surg pt with dynamap and not central monitoring at this time

## 2024-05-27 NOTE — Consult Note (Addendum)
 Providing Compassionate, Quality Care - Together  Neurosurgery Consult  Referring physician: EDP Reason for referral: Lumbar fx  History of Present Illness: Pt presented to [facility] ED after a fall from a treehouse. C/o LBP w/ any movement. No BLE symptoms, B&B dysfunction, SA.   Physical Exam:  BP (!) 137/98 (BP Location: Left Arm)   Pulse 81   Temp 97.9 F (36.6 C) (Oral)   Resp 16   Ht 5' 7.5 (1.715 m)   Wt 86.6 kg Comment: Wt from 12/2023  SpO2 97%   BMI 29.47 kg/m   CT T-SPINE NO CHARGE Result Date: 05/26/2024 CLINICAL DATA:  Trauma fall from 8 feet EXAM: CT Thoracic and Lumbar spine without contrast TECHNIQUE: Multiplanar CT images of the thoracic and lumbar spine were reconstructed from contemporary CT of the Chest, Abdomen, and Pelvis. RADIATION DOSE REDUCTION: This exam was performed according to the departmental dose-optimization program which includes automated exposure control, adjustment of the mA and/or kV according to patient size and/or use of iterative reconstruction technique. CONTRAST:  None or No additional COMPARISON:  Same day CT 05/26/2024 FINDINGS: CT THORACIC SPINE FINDINGS Alignment: Within normal limits. Vertebrae: Mild superior endplate deformity at T12 with Schmorl's node, probably chronic. Vertebral body heights are maintained. Paraspinal and other soft tissues: No acute finding Disc levels: Multilevel degenerative osteophytes. No abnormal disc space widening. CT LUMBAR SPINE FINDINGS Segmentation: 5 lumbar type vertebrae. Alignment: Normal. Vertebrae: Acute mildly comminuted fracture involves the L1 vertebral body, including the superior endplate. Approaching 50% loss of vertebral body height anteriorly. Mild 2 mm retropulsion of the posterior vertebral body, without significant canal stenosis. Fracture does not appear to involve the pedicles. Partial midline cleft at the spinous process of L1. Old appearing deformities involving the right transverse process of L2,  L3 and L4. Remaining vertebra demonstrate normal stature. Schmorl's node superior endplate L3. Paraspinal and other soft tissues: Paravertebral soft tissue swelling greatest at L1 level. Disc levels: Mild disc space narrowing at L1-L2 with suspected disc bulge. Disc bulges at L3-L4 and L4-L5. Suspect at least mild canal stenosis at L4-L5. Mild multilevel facet degenerative change. IMPRESSION: 1. Acute mildly comminuted moderate compression fracture involving the L1 vertebral body with close to 50% loss of vertebral body height anteriorly. Mild 2 mm retropulsion of the posterior vertebral body without significant canal stenosis. 2. Mild superior endplate deformity at T12 with Schmorl's node, probably chronic. 3. Old appearing deformities involving the right transverse process of L2, L3 and L4. 4. Multilevel degenerative changes of the thoracic and lumbar spine. 5. No definite acute osseous abnormality of the thoracic spine Electronically Signed   By: Luke Bun M.D.   On: 05/26/2024 18:44   CT L-SPINE NO CHARGE Result Date: 05/26/2024 CLINICAL DATA:  Trauma fall from 8 feet EXAM: CT Thoracic and Lumbar spine without contrast TECHNIQUE: Multiplanar CT images of the thoracic and lumbar spine were reconstructed from contemporary CT of the Chest, Abdomen, and Pelvis. RADIATION DOSE REDUCTION: This exam was performed according to the departmental dose-optimization program which includes automated exposure control, adjustment of the mA and/or kV according to patient size and/or use of iterative reconstruction technique. CONTRAST:  None or No additional COMPARISON:  Same day CT 05/26/2024 FINDINGS: CT THORACIC SPINE FINDINGS Alignment: Within normal limits. Vertebrae: Mild superior endplate deformity at T12 with Schmorl's node, probably chronic. Vertebral body heights are maintained. Paraspinal and other soft tissues: No acute finding Disc levels: Multilevel degenerative osteophytes. No abnormal disc space widening.  CT LUMBAR  SPINE FINDINGS Segmentation: 5 lumbar type vertebrae. Alignment: Normal. Vertebrae: Acute mildly comminuted fracture involves the L1 vertebral body, including the superior endplate. Approaching 50% loss of vertebral body height anteriorly. Mild 2 mm retropulsion of the posterior vertebral body, without significant canal stenosis. Fracture does not appear to involve the pedicles. Partial midline cleft at the spinous process of L1. Old appearing deformities involving the right transverse process of L2, L3 and L4. Remaining vertebra demonstrate normal stature. Schmorl's node superior endplate L3. Paraspinal and other soft tissues: Paravertebral soft tissue swelling greatest at L1 level. Disc levels: Mild disc space narrowing at L1-L2 with suspected disc bulge. Disc bulges at L3-L4 and L4-L5. Suspect at least mild canal stenosis at L4-L5. Mild multilevel facet degenerative change. IMPRESSION: 1. Acute mildly comminuted moderate compression fracture involving the L1 vertebral body with close to 50% loss of vertebral body height anteriorly. Mild 2 mm retropulsion of the posterior vertebral body without significant canal stenosis. 2. Mild superior endplate deformity at T12 with Schmorl's node, probably chronic. 3. Old appearing deformities involving the right transverse process of L2, L3 and L4. 4. Multilevel degenerative changes of the thoracic and lumbar spine. 5. No definite acute osseous abnormality of the thoracic spine Electronically Signed   By: Luke Bun M.D.   On: 05/26/2024 18:44   CT CHEST ABDOMEN PELVIS W CONTRAST Result Date: 05/26/2024 CLINICAL DATA:  Clemens out of tree house EXAM: CT CHEST, ABDOMEN, AND PELVIS WITH CONTRAST TECHNIQUE: Multidetector CT imaging of the chest, abdomen and pelvis was performed following the standard protocol during bolus administration of intravenous contrast. RADIATION DOSE REDUCTION: This exam was performed according to the departmental dose-optimization program  which includes automated exposure control, adjustment of the mA and/or kV according to patient size and/or use of iterative reconstruction technique. CONTRAST:  65mL OMNIPAQUE IOHEXOL 350 MG/ML SOLN COMPARISON:  Radiograph 05/26/2024 FINDINGS: CT CHEST FINDINGS Cardiovascular: Mild aortic atherosclerosis. No aneurysm. Normal aortic contour. Multi-vessel coronary vascular calcification. Normal cardiac size. No pericardial effusion Mediastinum/Nodes: Patent trachea. Left thyroid  nodule measuring 17 mm. No suspicious lymph nodes. Esophagus within normal limits Lungs/Pleura: Negative for pleural effusion or pneumothorax. Hazy areas of probable dependent atelectasis. No focal airspace disease Musculoskeletal: Sternum appears intact. See separately dictated thoracic CT CT ABDOMEN PELVIS FINDINGS Hepatobiliary: No focal liver abnormality is seen. No gallstones, gallbladder wall thickening, or biliary dilatation. Pancreas: Unremarkable. No pancreatic ductal dilatation or surrounding inflammatory changes. Spleen: Normal in size without focal abnormality. Adrenals/Urinary Tract: Adrenal glands are unremarkable. Kidneys are normal, without renal calculi, focal lesion, or hydronephrosis. Bladder is unremarkable. Stomach/Bowel: Stomach is within normal limits. No evidence of bowel wall thickening, distention, or inflammatory changes. Vascular/Lymphatic: Aortic atherosclerosis. No enlarged abdominal or pelvic lymph nodes. Reproductive: Prostate is slightly enlarged Other: Negative for pelvic effusion or free air. Small fat containing left inguinal hernia Musculoskeletal: Old right inferior pubic ramus fracture. Remote right acetabular fracture. See separately dictated lumbar CT IMPRESSION: 1. No CT evidence for acute intrathoracic, intra-abdominal, or intrapelvic abnormality. See separately dictated spine CT for additional findings 2. 17 mm left thyroid  nodule. Recommend thyroid  US  (ref: J Am Coll Radiol. 2015 Feb;12(2):  143-50).This should be performed non emergently 3. Aortic atherosclerosis. Aortic Atherosclerosis (ICD10-I70.0). Electronically Signed   By: Luke Bun M.D.   On: 05/26/2024 18:31   CT HEAD WO CONTRAST Result Date: 05/26/2024 CLINICAL DATA:  Fall 8 feet from tree house with head/neck trauma. EXAM: CT HEAD WITHOUT CONTRAST CT CERVICAL SPINE WITHOUT CONTRAST TECHNIQUE: Multidetector CT imaging  of the head and cervical spine was performed following the standard protocol without intravenous contrast. Multiplanar CT image reconstructions of the cervical spine were also generated. RADIATION DOSE REDUCTION: This exam was performed according to the departmental dose-optimization program which includes automated exposure control, adjustment of the mA and/or kV according to patient size and/or use of iterative reconstruction technique. COMPARISON:  Head CT 10/09/2019 FINDINGS: CT HEAD FINDINGS Brain: Ventricles, cisterns and other CSF spaces are normal. No mass, mass effect, shift of midline structures or acute hemorrhage. Remainder the exam is unchanged. Vascular: No hyperdense vessel or unexpected calcification. Skull: No acute fracture. Sinuses/Orbits: Orbits are normal. Air-fluid level over the left maxillary sinus. Minimal opacification over the ethmoid air cells. Other: None. CT CERVICAL SPINE FINDINGS Alignment: Normal. Skull base and vertebrae: Moderate spondylosis of the cervical spine to include uncovertebral joint spurring and facet arthropathy. Vertebral body heights are maintained. No acute fracture. Minimal bilateral neural foraminal narrowing at the C5-6 level and C6-7 levels. Soft tissues and spinal canal: No prevertebral fluid or swelling. No visible canal hematoma. Disc levels: Disc space narrowing at the C5-6 and C6-7 levels as well as the C7-T1 level. Upper chest: No acute findings. Other: None. IMPRESSION: 1. No acute brain injury. 2. No acute cervical spine injury. 3. 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 Minimal bilateral neural foraminal narrowing at the C5-6 and C6-7 levels. Electronically Signed   By: Toribio Agreste M.D.   On: 05/26/2024 18:31   CT CERVICAL SPINE WO CONTRAST Result Date: 05/26/2024 CLINICAL DATA:  Fall 8 feet from tree house with head/neck trauma. EXAM: CT HEAD WITHOUT CONTRAST CT CERVICAL SPINE WITHOUT CONTRAST TECHNIQUE: Multidetector CT imaging of the head and cervical spine was performed following the standard protocol without intravenous contrast. Multiplanar CT image reconstructions of the cervical spine were also generated. RADIATION DOSE REDUCTION: This exam was performed according to the departmental dose-optimization program which includes automated exposure control, adjustment of the mA and/or kV according to patient size and/or use of iterative reconstruction technique. COMPARISON:  Head CT 10/09/2019 FINDINGS: CT HEAD FINDINGS Brain: Ventricles, cisterns and other CSF spaces are normal. No mass, mass effect, shift of midline structures or acute hemorrhage. Remainder the exam is unchanged. Vascular: No hyperdense vessel or unexpected calcification. Skull: No acute fracture. Sinuses/Orbits: Orbits are normal. Air-fluid level over the left maxillary sinus. Minimal opacification over the ethmoid air cells. Other: None. CT CERVICAL SPINE FINDINGS Alignment: Normal. Skull base and vertebrae: Moderate spondylosis of the cervical spine to include uncovertebral joint spurring and facet arthropathy. Vertebral body heights are maintained. No acute fracture. Minimal bilateral neural foraminal narrowing at the C5-6 level and C6-7 levels. Soft tissues and spinal canal: No prevertebral fluid or swelling. No visible canal hematoma. Disc levels: Disc space narrowing at the C5-6 and C6-7 levels as well as the C7-T1 level. Upper chest: No acute findings. Other: None. IMPRESSION: 1. No acute brain injury. 2. No acute cervical spine  injury. 3. 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 Minimal bilateral neural foraminal narrowing at the C5-6 and C6-7 levels. Electronically Signed   By: Toribio Agreste M.D.   On: 05/26/2024 18:31   DG Pelvis Portable Result Date: 05/26/2024 CLINICAL DATA:  Fall. EXAM: PORTABLE PELVIS 1-2 VIEWS COMPARISON:  None Available. FINDINGS: There is no evidence of pelvic fracture or diastasis. No pelvic bone lesions are seen. IMPRESSION: Negative. Electronically  Signed   By: Lynwood Landy Raddle M.D.   On: 05/26/2024 17:33   DG Chest Port 1 View Result Date: 05/26/2024 CLINICAL DATA:  Fall. EXAM: PORTABLE CHEST 1 VIEW COMPARISON:  October 28, 2023. FINDINGS: Stable cardiomediastinal silhouette. Both lungs are clear. The visualized skeletal structures are unremarkable. IMPRESSION: No active disease. Electronically Signed   By: Lynwood Landy Raddle M.D.   On: 05/26/2024 17:32   Awake, alert, oriented Speech fluent, appropriate 5/5 BUE/BLE SILTx4   Impression/Assessment:  This is a 79 male who suffered a L1 compression fracture following a fall. Fracture without posterior element involvement or significant bony retropulsion.   Plan:  -TLSO when OOB -Pain control -Outpatient follow up with standing radiographs.   Nathanuel Cabreja CAYLIN Lylian Sanagustin, PA-C

## 2024-05-27 NOTE — Evaluation (Addendum)
 Occupational Therapy Evaluation Patient Details Name: Dwayne Cross MRN: [id number] DOB: 08-May-1951 Today's Date: 05/27/2024   History of Present Illness   73 yo male s/p fall ~ 8 ft with back pain CT (+) compression fx L1  PMH HTN prostate CA     Clinical Impressions PT admitted with L1 compression fx from falling through a tree house floor attempting to repair it.. Pt currently with functional limitiations due to the deficits listed below (see OT problem list). Pt at baseline is indep in all adls and drives. Pt's wife recent back surgery and hx of working as CNA but needs patient to be mod I for any transfers. Education started on back precautions and don doff brace. Pt needs reinforcement for LB dressing/ bathing and starting to complete bed mobility with HOB <20 but elevated mattress height.  Pt will benefit from skilled OT to increase their independence and safety with adls and balance to allow discharge outpatient.      If plan is discharge home, recommend the following:   A little help with walking and/or transfers;A lot of help with bathing/dressing/bathroom;Assist for transportation     Functional Status Assessment   Patient has had a recent decline in their functional status and demonstrates the ability to make significant improvements in function in a reasonable and predictable amount of time.     Equipment Recommendations   None recommended by OT (DME present at the home. will need RW to use temporary in the home)     Recommendations for Other Services   PT consult     Precautions/Restrictions   Precautions Precautions: Fall;Back Required Braces or Orthoses: Spinal Brace Spinal Brace: Thoracolumbosacral orthotic;Applied in sitting position Restrictions Weight Bearing Restrictions Per Provider Order: No     Mobility Bed Mobility Overal bed mobility: Needs Assistance Bed Mobility: Rolling, Sidelying to Sit Rolling: Min assist Sidelying to sit: Mod  assist, +2 for safety/equipment, Used rails, HOB elevated       General bed mobility comments: educated spouse how to use RW at side of bed for railing and in option for bed rail that fits under mattress, using rails with cues for back precautions to roll for brace adjustment, removing soiled clothing/linen under pt; side to sit with HOB up and slowing helping legs to lower off EOB as painful initiallly in flat with moving legs off EOB    Transfers Overall transfer level: Needs assistance Equipment used: Rolling walker (2 wheels) Transfers: Sit to/from Stand Sit to Stand: From elevated surface, Min assist           General transfer comment: cues for hand placement, assist for balance      Balance Overall balance assessment: Needs assistance   Sitting balance-Leahy Scale: Fair     Standing balance support: Bilateral upper extremity supported Standing balance-Leahy Scale: Poor Standing balance comment: UE support for balance                           ADL either performed or assessed with clinical judgement   ADL Overall ADL's : Needs assistance/impaired Eating/Feeding: Set up;Sitting Eating/Feeding Details (indicate cue type and reason): pt was being self fed supine on arrival but at by the end of session able to sit in chair and hold cup     Upper Body Bathing: Minimal assistance;Sitting   Lower Body Bathing: Maximal assistance   Upper Body Dressing : Maximal assistance Upper Body Dressing Details (indicate cue type and reason):  don doff brace needs further education. Pt found to have brace under him supine in the bed on arrival. pt educated that brace can be placed on at eob. pt educaetd on proper fit and adjusted during session Lower Body Dressing: Maximal assistance Lower Body Dressing Details (indicate cue type and reason): don socks supine by PT Toilet Transfer: Ambulation;Rolling walker (2 wheels);Minimal assistance;BSC/3in1 Statistician Details  (indicate cue type and reason): elevated surface benefical         Functional mobility during ADLs: Contact guard assist;Rolling walker (2 wheels)       Vision Baseline Vision/History: 1 Wears glasses Vision Assessment?: No apparent visual deficits     Perception         Praxis         Pertinent Vitals/Pain Pain Assessment Pain Assessment: 0-10 Pain Score: 5  Pain Location: lower back to butt Pain Descriptors / Indicators: Aching, Dull Pain Intervention(s): Monitored during session, Premedicated before session, Repositioned, Limited activity within patient's tolerance     Extremity/Trunk Assessment Upper Extremity Assessment Upper Extremity Assessment: Overall WFL for tasks assessed   Lower Extremity Assessment Lower Extremity Assessment: Defer to PT evaluation RLE Deficits / Details: AROM WFL, strength WFL except hip flexion NT due to back pain RLE Sensation: WNL LLE Deficits / Details: AROM WFL, strength WFL except hip flexion NT due to back pain LLE Sensation: WNL   Cervical / Trunk Assessment Cervical / Trunk Assessment: Other exceptions Cervical / Trunk Exceptions: L1 compression fx   Communication Communication Communication: No apparent difficulties   Cognition Arousal: Alert Behavior During Therapy: WFL for tasks assessed/performed Cognition: No apparent impairments                               Following commands: Intact       Cueing  General Comments   Cueing Techniques: Verbal cues  wife in room with recent back surgery (3months post op) so familiar with education. handout provided for back precautions. Pt advised to speak with provider prior to prostate surgery in Novebmer 2025 to ensure back safety.   Exercises     Shoulder Instructions      Home Living Family/patient expects to be discharged to:: Private residence Living Arrangements: Spouse/significant other Available Help at Discharge: Family Type of Home: House Home  Access: Stairs to enter [employer] of Steps: 2 in front no rail (post), 4 in back with rail   Home Layout: Two level;Able to live on main level with bedroom/bathroom     Bathroom Shower/Tub: Producer, television/film/video: Standard     Home Equipment: Pharmacist, hospital (2 wheels);BSC/3in1;Grab bars - tub/shower;Hand held shower head   Additional Comments: was scheduled for prostate surgery early November      Prior Functioning/Environment Prior Level of Function : Independent/Modified Independent               ADLs Comments: works as Occupational psychologist in [place]     OT Problem List: Decreased strength;Impaired balance (sitting and/or standing);Decreased activity tolerance;Decreased safety awareness;Decreased knowledge of use of DME or AE;Decreased knowledge of precautions;Pain   OT Treatment/Interventions: Self-care/ADL training;Therapeutic exercise;Energy conservation;DME and/or AE instruction;Therapeutic activities;Patient/family education;Balance training      OT Goals(Current goals can be found in the care plan section)   Acute Rehab OT Goals Patient Stated Goal: to get recovered to see granddaughter in nov OT Goal Formulation: With patient/family Time For Goal Achievement: 06/10/24 Potential to  Achieve Goals: Good   OT Frequency:  Min 2X/week    Co-evaluation PT/OT/SLP Co-Evaluation/Treatment: Yes Reason for Co-Treatment: For patient/therapist safety;To address functional/ADL transfers;Other (comment) (pain) PT goals addressed during session: Mobility/safety with mobility;Proper use of DME;Balance OT goals addressed during session: ADL's and self-care      AM-PAC OT 6 Clicks Daily Activity     Outcome Measure Help from another person eating meals?: A Little Help from another person taking care of personal grooming?: A Little Help from another person toileting, which includes using toliet, bedpan, or urinal?: A Lot Help from another  person bathing (including washing, rinsing, drying)?: A Lot Help from another person to put on and taking off regular upper body clothing?: A Lot Help from another person to put on and taking off regular lower body clothing?: A Lot 6 Click Score: 14   End of Session Equipment Utilized During Treatment: Gait belt;Rolling walker (2 wheels);Back brace Nurse Communication: Mobility status;Precautions  Activity Tolerance: Patient tolerated treatment well Patient left: in chair;with call bell/phone within reach;with chair alarm set;with family/visitor present  OT Visit Diagnosis: Unsteadiness on feet (R26.81);Muscle weakness (generalized) (M62.81)                Time: 1030-1055 OT Time Calculation (min): 25 min Charges:  OT General Charges $OT Visit: 1 Visit OT Evaluation $OT Eval Moderate Complexity: 1 Mod   Brynn, OTR/L  Acute Rehabilitation Services Office: [phone] .   Ely Molt 05/27/2024, 2:52 PM

## 2024-05-27 NOTE — Hospital Course (Signed)
 Dwayne Cross is a 73 y.o. male with medical history significant for HTN, prostate cancer who is admitted with an acute L1 vertebral compression fracture after a fall at home.

## 2024-05-27 NOTE — Progress Notes (Addendum)
 Progress Note   Patient: Dwayne Cross FMW:[id number] DOB: [birth year] DOA: 05/26/2024     0 DOS: the patient was seen and examined on 05/27/2024   Brief hospital course:  CC: low back pain  HPI: Dwayne Cross is a 73 y.o. male with medical history significant for HTN, prostate cancer who presented to the ED for evaluation of back pain after a fall as he was working on a tree house at home, fell from height of about 8 feet, landed directly on his back. Pt had acute onset of low back pain, denied head injury or loss of consciousness.  He denied any new leg weakness or numbness/tingling radiating down the legs.  Extensive imaging was obtained in the ED. Patient was found to have an acute L1 compression fracture.  Neurosurgery was consulted in the ED, recommended conservative management with TLSO brace, pain control, PT/OT evaluations and outpatient follow up.  Due to intractable pain and inability to ambulate safely, pt was admitted to the hospital.   Assessment and Plan:  Acute mildly comminuted L1 vertebral body compression fracture: Occurring after falling approximately 8 feet when the floor gave away in the tree house he was standing in.  Approximately 50% vertebral body height loss noted on CT.  EDP spoke with neurosurgery who recommended TLSO and outpatient follow-up.  Pain has been uncontrolled and patient unable to ambulate safely therefore admission was requested. --Neurosurgery consulted, appreciate recommendations --TLSO brace for OOB activity --Pain control: scheduled Tylenol , PRN oxycodone , PRN Robaxin, IV Dilaudid for breakthrough pain --PT/OT evaluations --Outpatient Neurosurgery follow up to be arranged with repeat standing x-rays at that time   Mild AKI - POA, resolved with IV fluids. Cr 1.40 on admission is improved to 1.06 today after 500 LR bolus on admission. --Resume home ARB/hydrochlorothiazide    Hypertension:BP's mildly elevated this morning. --Resume home  ARB/hydrochlorothiazide    Prostate cancer: Patient is scheduled for prostatectomy on 07/02/2024 with Dr. Renda.   Depression: --Continue Wellbutrin   Left thyroid  nodule: A 17 mm left thyroid  nodule was noted on CT imaging.   --Recommend outpatient thyroid  U/S --Follow up with PCP        Subjective: Pt see up in recliner, wife at bedside this AM.  Pt reports being drowsy from pain medications.  Pain is still significant but improved compared to last night.  He denies leg weakness when working with PT.   Physical Exam: Vitals:   05/27/24 0121 05/27/24 0336 05/27/24 0823 05/27/24 1156  BP: (!) 135/94 (!) 148/97 (!) 137/98 130/74  Pulse: 84 73 81 66  Resp: 19 20 16    Temp: 98.9 F (37.2 C) 98.6 F (37 C) 97.9 F (36.6 C) 97.8 F (36.6 C)  TempSrc: Oral Oral Oral Oral  SpO2:  96% 97% 94%  Weight:      Height:       General exam: awake, drowsy appearing, no acute distress HEENT: atraumatic, clear conjunctiva, anicteric sclera, moist mucus membranes, hearing grossly normal  Respiratory system: CTAB, no wheezes, rales or rhonchi, normal respiratory effort. Cardiovascular system: normal S1/S2, RRR, no JVD, murmurs, rubs, gallops, no pedal edema.   Gastrointestinal system: soft, NT, ND Central nervous system: A&O x 3. no gross focal neurologic deficits, normal speech Musculoskeletal system: wearing TSLO brace Skin: dry, intact, normal temperature Psychiatry: normal mood, congruent affect, judgement and insight appear normal   Data Reviewed:  Notable labs -- Cr improved 1.40 >> 1.06, Ca 8.7, otherwise normal BMP and CBC  Family Communication:  wife at bedside  Disposition: Status is: Observation Pt remains admitted for pain control and pending PT/OT evaluation to assess safety with ambulation   Planned Discharge Destination: Home with Home Health    Time spent: 42 minutes  Author: Burnard DELENA Cunning, DO 05/27/2024 12:42 PM  For on call review [email or web address].

## 2024-05-27 NOTE — Progress Notes (Signed)
 [id number] [facility] Liaison Note  Received referral from Rosaline Mustache, [facility] for Home Health PT/OT at discharge.   Referral accepted with planned SOC 10.3 or 10.4 with plan for hospital discharge 10.2.  Please call with any questions or concerns.  Thank you for the opportunity to participate in this patients care.  Randine Nail, BSN, [employer] [phone]

## 2024-05-28 ENCOUNTER — Observation Stay (HOSPITAL_COMMUNITY)

## 2024-05-28 ENCOUNTER — Encounter (HOSPITAL_COMMUNITY): Payer: Self-pay | Admitting: Radiology

## 2024-05-28 DIAGNOSIS — S32011A Stable burst fracture of first lumbar vertebra, initial encounter for closed fracture: Secondary | ICD-10-CM | POA: Diagnosis not present

## 2024-05-28 DIAGNOSIS — S32010A Wedge compression fracture of first lumbar vertebra, initial encounter for closed fracture: Secondary | ICD-10-CM | POA: Diagnosis not present

## 2024-05-28 MED ORDER — OXYCODONE HCL 5 MG PO TABS
10.0000 mg | ORAL_TABLET | ORAL | Status: DC | PRN
  Administered 2024-05-28 – 2024-05-29 (×2): 10 mg via ORAL
  Administered 2024-05-29: 15 mg via ORAL
  Administered 2024-05-30: 10 mg via ORAL
  Filled 2024-05-28 (×2): qty 2
  Filled 2024-05-28 (×2): qty 3
  Filled 2024-05-28: qty 2

## 2024-05-28 MED ORDER — HYDROMORPHONE HCL 1 MG/ML IJ SOLN
0.5000 mg | INTRAMUSCULAR | Status: DC | PRN
  Administered 2024-05-28 – 2024-05-30 (×4): 1 mg via INTRAVENOUS
  Filled 2024-05-28 (×4): qty 1

## 2024-05-28 NOTE — Progress Notes (Signed)
 Occupational Therapy Treatment Patient Details Name: Dwayne Cross MRN: [id number] DOB: Dec 30, 1950 Today's Date: 05/28/2024   History of present illness 73 yo male s/p fall ~ 8 ft with back pain CT (+) compression fx L1  PMH HTN prostate CA   OT comments  Pt making good progress towards OT goals though does endorse back pain more severe than yesterday. Overall, pt able to manage bed mobility (from flat bed) and mobility in room using RW without physical assistance. Pt continues to require assist for LB ADLs though improving; denies need for full AE kit. Emphasis on TLSO brace mgmt (and optimal compression to minimize pain), comfortable positioning in chairs with adequate back support. Continue to recommend consideration of HHOT upon DC. Wife present, supportive and able to provide light assist with tasks.      If plan is discharge home, recommend the following:  A lot of help with bathing/dressing/bathroom;Assist for transportation;Assistance with cooking/housework   Equipment Recommendations  None recommended by OT    Recommendations for Other Services      Precautions / Restrictions Precautions Precautions: Fall;Back Required Braces or Orthoses: Spinal Brace Spinal Brace: Thoracolumbosacral orthotic;Applied in sitting position Restrictions Weight Bearing Restrictions Per Provider Order: No       Mobility Bed Mobility Overal bed mobility: Needs Assistance Bed Mobility: Rolling, Sidelying to Sit Rolling: Supervision, Used rails Sidelying to sit: Supervision, Used rails       General bed mobility comments: from flat bed, pt able to log roll though did opt to use bedrails.    Transfers Overall transfer level: Needs assistance Equipment used: Rolling walker (2 wheels) Transfers: Sit to/from Stand Sit to Stand: Contact guard assist                 Balance Overall balance assessment: Needs assistance Sitting-balance support: Single extremity supported, Bilateral  upper extremity supported, Feet supported Sitting balance-Leahy Scale: Fair Sitting balance - Comments: BUE on bed for support to minimize back pain   Standing balance support: Bilateral upper extremity supported Standing balance-Leahy Scale: Poor                             ADL either performed or assessed with clinical judgement   ADL Overall ADL's : Needs assistance/impaired Eating/Feeding: Independent;Sitting                   Lower Body Dressing: Moderate assistance;Sitting/lateral leans;Sit to/from stand Lower Body Dressing Details (indicate cue type and reason): pt able to demo figure four position to reach B feet sitting in arm chair with back support. Brought AE into room and provided initial education on reacher and sock aid w/ pt/wife familiar but reports likely wont need sock aid (has 2 reachers at home) Toilet Transfer: Supervision/safety;Ambulation;Rolling walker (2 wheels) Toilet Transfer Details (indicate cue type and reason): able to mobilize to/from bathroom using RW. stood while wife assisted with urinal Toileting- Clothing Manipulation and Hygiene: Minimal assistance;Sitting/lateral lean;Sit to/from stand Toileting - Clothing Manipulation Details (indicate cue type and reason): wife assisting with urinal and reaching LB clothing that was beyond pt reach     Functional mobility during ADLs: Supervision/safety;Rolling walker (2 wheels) General ADL Comments: Emphasis on bed mobility, TLSO brace mgmt (Mod A to manage) with increased compression/tightness from velcro strap to provide more support with pt reporting some relief. Discussed sitting in chairs with adequate back support to minimize pain (small armchair in room > recliner in room).  Extremity/Trunk Assessment Upper Extremity Assessment Upper Extremity Assessment: Overall WFL for tasks assessed;Right hand dominant   Lower Extremity Assessment Lower Extremity Assessment: Defer to PT  evaluation        Vision   Vision Assessment?: No apparent visual deficits   Perception     Praxis     Communication Communication Communication: No apparent difficulties   Cognition Arousal: Alert Behavior During Therapy: WFL for tasks assessed/performed Cognition: No apparent impairments                               Following commands: Intact        Cueing   Cueing Techniques: Verbal cues  Exercises      Shoulder Instructions       General Comments Wife entering during session, supportive    Pertinent Vitals/ Pain       Pain Assessment Pain Assessment: Faces Faces Pain Scale: Hurts even more Pain Location: lower to mid back Pain Descriptors / Indicators: Grimacing, Guarding Pain Intervention(s): Monitored during session, Limited activity within patient's tolerance, Premedicated before session, Repositioned  Home Living                                          Prior Functioning/Environment              Frequency  Min 2X/week        Progress Toward Goals  OT Goals(current goals can now be found in the care plan section)  Progress towards OT goals: Progressing toward goals  Acute Rehab OT Goals Patient Stated Goal: pain control OT Goal Formulation: With patient/family Time For Goal Achievement: 06/10/24 Potential to Achieve Goals: Good ADL Goals Pt Will Perform Lower Body Bathing: with set-up;with adaptive equipment;sit to/from stand Pt Will Perform Upper Body Dressing: with modified independence;sitting Pt Will Perform Lower Body Dressing: with supervision;with adaptive equipment;sit to/from stand Pt Will Transfer to Toilet: with supervision;ambulating;bedside commode Additional ADL Goal #1: pt will complete bed mobility supervision level with hob<20 degrees and elevated mattress height  Plan      Co-evaluation                 AM-PAC OT 6 Clicks Daily Activity     Outcome Measure   Help from  another person eating meals?: None Help from another person taking care of personal grooming?: A Little Help from another person toileting, which includes using toliet, bedpan, or urinal?: A Little Help from another person bathing (including washing, rinsing, drying)?: A Lot Help from another person to put on and taking off regular upper body clothing?: A Little Help from another person to put on and taking off regular lower body clothing?: A Lot 6 Click Score: 17    End of Session Equipment Utilized During Treatment: Rolling walker (2 wheels);Back brace  OT Visit Diagnosis: Unsteadiness on feet (R26.81);Muscle weakness (generalized) (M62.81)   Activity Tolerance Patient tolerated treatment well;Patient limited by pain   Patient Left in chair;with call bell/phone within reach;with family/visitor present (in small armchair to eat lunch with spouse- educated to press call button when done and ready to get in bed)   Nurse Communication Other (comment) (NT notified)        Time: 8668-8644 OT Time Calculation (min): 24 min  Charges: OT General Charges $OT Visit: 1 Visit OT Treatments $Self Care/Home  Management : 8-22 mins  Mliss NOVAK, OTR/L Acute Rehab Services Office: [phone]   Mliss Fish 05/28/2024, 2:11 PM

## 2024-05-28 NOTE — Progress Notes (Signed)
 Transition of Care [facility]) - CAGE-AID Screening   Patient Details  Name: Dwayne Cross MRN: [id number] Date of Birth: [birth year]  [facility]) CM/SW Contact:    Bernardino Mayotte, RN Phone Number: 05/28/2024, 4:34 AM   Clinical Narrative:  Patient denies the use of alcohol and illicit drugs. Resources not given at this time.  CAGE-AID Screening:    Have You Ever Felt You Ought to Cut Down on Your Drinking or Drug Use?: No Have People Annoyed You By Critizing Your Drinking Or Drug Use?: No Have You Felt Bad Or Guilty About Your Drinking Or Drug Use?: No Have You Ever Had a Drink or Used Drugs First Thing In The Morning to Steady Your Nerves or to Get Rid of a Hangover?: No CAGE-AID Score: 0  Substance Abuse Education Offered: No

## 2024-05-28 NOTE — Plan of Care (Signed)
   Problem: Activity: Goal: Risk for activity intolerance will decrease Outcome: Progressing   Problem: Pain Managment: Goal: General experience of comfort will improve and/or be controlled Outcome: Progressing   Problem: Safety: Goal: Ability to remain free from injury will improve Outcome: Progressing

## 2024-05-28 NOTE — Plan of Care (Signed)

## 2024-05-28 NOTE — Plan of Care (Signed)

## 2024-05-28 NOTE — Progress Notes (Signed)
 Physical Therapy Treatment Patient Details Name: Dwayne Cross MRN: [id number] DOB: 1950-09-22 Today's Date: 05/28/2024   History of Present Illness 73 yo male s/p fall ~ 8 ft with back pain CT (+) compression fx L1  PMH HTN prostate CA    PT Comments  Patient able to negotiate stairs today appropriate for home entry with reverse technique and min A.  Wife present to watch and states will have other family members to assist to enter the home.  Patient with more pain today reporting limited control this am though RN in to deliver meds and MD in to add in other options.  Patient remains appropriate for home with HHPT at d/c.  Encouraged frequent mobility with assist.     If plan is discharge home, recommend the following: A little help with walking and/or transfers;Assist for transportation;A little help with bathing/dressing/bathroom;Help with stairs or ramp for entrance   Can travel by private vehicle        Equipment Recommendations  None recommended by PT    Recommendations for Other Services       Precautions / Restrictions Precautions Precautions: Back;Fall Required Braces or Orthoses: Spinal Brace Spinal Brace: Thoracolumbosacral orthotic;Applied in sitting position     Mobility  Bed Mobility Overal bed mobility: Needs Assistance Bed Mobility: Rolling Rolling: Supervision, Used rails Sidelying to sit: Min assist       General bed mobility comments: cues for sequence and A for lifting past area of pain during side to sit    Transfers Overall transfer level: Needs assistance Equipment used: Rolling walker (2 wheels) Transfers: Sit to/from Stand Sit to Stand: Contact guard assist           General transfer comment: cuse for hand placement    Ambulation/Gait Ambulation/Gait assistance: Contact guard assist Gait Distance (Feet): 190 Feet Assistive device: Rolling walker (2 wheels) Gait Pattern/deviations: Step-to pattern, Step-through pattern        General Gait Details: wide RW, able to manage on turns   Stairs Stairs: Yes Stairs assistance: Contact guard assist Stair Management: Backwards, With walker, Step to pattern Number of Stairs: 4 General stair comments: cue for maintaining back precautions as walker moved from step to step   Wheelchair Mobility     Tilt Bed    Modified Rankin (Stroke Patients Only)       Balance Overall balance assessment: Needs assistance Sitting-balance support: Feet supported Sitting balance-Leahy Scale: Fair Sitting balance - Comments: BUE on bed for support to minimize back pain   Standing balance support: Bilateral upper extremity supported Standing balance-Leahy Scale: Poor Standing balance comment: UE support for balance                            Communication Communication Communication: No apparent difficulties  Cognition Arousal: Alert Behavior During Therapy: Flat affect                             Following commands: Intact      Cueing Cueing Techniques: Verbal cues  Exercises      General Comments General comments (skin integrity, edema, etc.): wife present and assisting, educated and issued handout on sequence/technique for steps      Pertinent Vitals/Pain Pain Assessment Pain Score: 8  Pain Location: lower to mid back Pain Descriptors / Indicators: Grimacing, Guarding Pain Intervention(s): Monitored during session, Repositioned, RN gave pain meds during session  Home Living                          Prior Function            PT Goals (current goals can now be found in the care plan section) Progress towards PT goals: Progressing toward goals    Frequency           PT Plan      Co-evaluation              AM-PAC PT 6 Clicks Mobility   Outcome Measure  Help needed turning from your back to your side while in a flat bed without using bedrails?: A Little Help needed moving from lying on your back  to sitting on the side of a flat bed without using bedrails?: A Little Help needed moving to and from a bed to a chair (including a wheelchair)?: A Little Help needed standing up from a chair using your arms (e.g., wheelchair or bedside chair)?: A Little Help needed to walk in hospital room?: A Little Help needed climbing 3-5 steps with a railing? : A Little 6 Click Score: 18    End of Session Equipment Utilized During Treatment: Gait belt;Back brace Activity Tolerance: Patient tolerated treatment well Patient left: with call bell/phone within reach;in chair;with family/visitor present   PT Visit Diagnosis: Other abnormalities of gait and mobility (R26.89);Pain;Difficulty in walking, not elsewhere classified (R26.2) Pain - part of body:  (back)     Time: 1133-1203 PT Time Calculation (min) (ACUTE ONLY): 30 min  Charges:    $Gait Training: 8-22 mins $Therapeutic Activity: 8-22 mins PT General Charges $$ ACUTE PT VISIT: 1 Visit                     [name], PT Acute Rehabilitation Services [phone] 05/28/2024    [name] 05/28/2024, 6:46 PM

## 2024-05-28 NOTE — Progress Notes (Signed)
 Progress Note   Patient: Dwayne Cross FMW:[id number] DOB: 08/02/1951 DOA: 05/26/2024     0 DOS: the patient was seen and examined on 05/28/2024   Brief hospital course:  CC: low back pain  HPI: Dwayne Cross is a 73 y.o. male with medical history significant for HTN, prostate cancer who presented to the ED for evaluation of back pain after a fall as he was working on a tree house at home, fell from height of about 8 feet, landed directly on his back. Pt had acute onset of low back pain, denied head injury or loss of consciousness.  He denied any new leg weakness or numbness/tingling radiating down the legs.  Extensive imaging was obtained in the ED. Patient was found to have an acute L1 compression fracture.  Neurosurgery was consulted in the ED, recommended conservative management with TLSO brace, pain control, PT/OT evaluations and outpatient follow up.  Due to intractable pain and inability to ambulate safely, pt was admitted to the hospital.   Assessment and Plan:  Acute mildly comminuted L1 vertebral body compression fracture: Occurring after falling approximately 8 feet when the floor gave away in the tree house he was standing in.  Approximately 50% vertebral body height loss noted on CT.  EDP spoke with neurosurgery who recommended TLSO and outpatient follow-up.  Pain has been uncontrolled and patient unable to ambulate safely therefore admission was requested. --Neurosurgery consulted, appreciate recommendations --Sent staff msg to Neurosurgery PA to update on status of uncontrolled pain and pt inquiring about kypho --TLSO brace for OOB activity --Pain control: scheduled Tylenol , PRN oxycodone , PRN Robaxin, IV Dilaudid for breakthrough pain.  Will increase oxycodone  from 5-10 mg up to 10-15 mg Q4H PRN  --PT/OT evaluations -- Home Health recommended --Outpatient Neurosurgery follow up to be arranged with repeat standing x-rays   Mild AKI - POA, resolved with IV fluids. Cr  1.40 on admission is improved to 1.06 today after 500 LR bolus on admission. --Resume home ARB/hydrochlorothiazide    Hypertension:BP's mildly elevated this morning. --Resume home ARB/hydrochlorothiazide    Prostate cancer: Patient is scheduled for prostatectomy on 07/02/2024 with Dr. Renda.   Depression: --Continue Wellbutrin   Left thyroid  nodule: A 17 mm left thyroid  nodule was noted on CT imaging.   --Recommend outpatient thyroid  U/S --Follow up with PCP        Subjective: Pt seen ambulating with PT in the hall, and later in the room up in recliner.  He reports worse pain today vs yesterday.  He'd been given 10 mg oxycodone  about 1 hour before my encounter, and pain remained unchanged at 8 out of 10.  No other acute complaints besides pain.  He reports a friend recently suffered a similar injury, and kyphoplasty was performed, asks about this procedure.    Physical Exam: Vitals:   05/27/24 2030 05/28/24 0325 05/28/24 0752 05/28/24 1133  BP: (!) 143/101 (!) (P) 127/93 (!) 160/93 135/86  Pulse: 77 (P) 63 82 73  Resp:  (P) 18    Temp: 99.8 F (37.7 C)  99.1 F (37.3 C) 98 F (36.7 C)  TempSrc:  (P) Oral    SpO2: 96% (P) 95% 95% 93%  Weight:      Height:       General exam: awake, alert, no acute distress but appears in pain HEENT: moist mucus membranes, hearing grossly normal  Respiratory system: CTAB, no wheezes, rales or rhonchi, normal respiratory effort. Cardiovascular system: normal S1/S2, RRR, no JVD, murmurs, rubs,  gallops, no pedal edema.   Gastrointestinal system: soft, NT, ND Central nervous system: A&O x 3. no gross focal neurologic deficits, normal speech Musculoskeletal system: wearing TSLO brace Skin: dry, intact, normal temperature Psychiatry: normal mood, congruent affect, judgement and insight appear normal   Data Reviewed: No new labs today Notable labs 10/1 -- Cr improved 1.40 >> 1.06, Ca 8.7, otherwise normal BMP and CBC  Family Communication:  wife at bedside on rounds  Disposition: Status is: Observation. Pt remains admitted due to uncontrolled pain. Requiring IV meds.    Planned Discharge Destination: Home with Home Health    Time spent: 45 minutes  Author: Burnard DELENA Cunning, DO 05/28/2024 1:10 PM  For on call review [email or web address].

## 2024-05-29 DIAGNOSIS — S32010A Wedge compression fracture of first lumbar vertebra, initial encounter for closed fracture: Secondary | ICD-10-CM | POA: Diagnosis not present

## 2024-05-29 MED ORDER — SENNOSIDES-DOCUSATE SODIUM 8.6-50 MG PO TABS
1.0000 | ORAL_TABLET | Freq: Every day | ORAL | Status: DC
  Administered 2024-05-29: 1 via ORAL
  Filled 2024-05-29: qty 1

## 2024-05-29 MED ORDER — POLYETHYLENE GLYCOL 3350 17 G PO PACK
17.0000 g | PACK | Freq: Every day | ORAL | Status: DC | PRN
  Administered 2024-05-29: 17 g via ORAL
  Filled 2024-05-29: qty 1

## 2024-05-29 MED ORDER — POLYETHYLENE GLYCOL 3350 17 G PO PACK: 17.0000 g | PACK | Freq: Once | ORAL | Status: DC

## 2024-05-29 MED ORDER — LIDOCAINE 5 % EX PTCH
1.0000 | MEDICATED_PATCH | CUTANEOUS | Status: DC
  Administered 2024-05-29: 1 via TRANSDERMAL
  Filled 2024-05-29: qty 1

## 2024-05-29 MED ORDER — METHOCARBAMOL 750 MG PO TABS
750.0000 mg | ORAL_TABLET | Freq: Three times a day (TID) | ORAL | Status: DC | PRN
  Administered 2024-05-30: 750 mg via ORAL
  Filled 2024-05-29: qty 1

## 2024-05-29 NOTE — Plan of Care (Signed)

## 2024-05-29 NOTE — Progress Notes (Signed)
    Providing Compassionate, Quality Care - Together   NEUROSURGERY PROGRESS NOTE     S: No issues overnight. TRH asked NSGY to see again given persistent pain.    O: EXAM:  BP 128/85 (BP Location: Left Arm)   Pulse 70   Temp 98.2 F (36.8 C) (Oral)   Resp 18   Ht 5' 7.5 (1.715 m)   Wt 86.6 kg Comment: Wt from 12/2023  SpO2 95%   BMI 29.47 kg/m    MR LUMBAR SPINE WO CONTRAST Result Date: 05/28/2024 EXAM: MRI LUMBAR SPINE 05/28/2024 05:43:00 PM TECHNIQUE: Multiplanar multisequence MRI of the lumbar spine was performed without the administration of intravenous contrast. COMPARISON: CT lumbar spine 05/26/2024. CLINICAL HISTORY: Spine fracture, lumbar, traumatic. FINDINGS: BONES AND ALIGNMENT: There is an incomplete burst fracture at L1 with approximately 50% height loss and 2 mm of retropulsion and diffuse bone marrow edema. Superior endplate schmorl's nodes are present at T12 and L3. Alignment is altered at L1 due to the fracture and retropulsion. SPINAL CORD: The conus terminates normally. There is mild spinal canal stenosis at L1 due to retropulsion. SOFT TISSUES: No paraspinal mass. T12-L1: Superior endplate schmorl's nodes at T12. The L1 incomplete burst fracture with retropulsion and edema affects this level. Mild spinal canal stenosis at L1 due to retropulsion. L1-L2: No significant disc herniation. No neural foraminal narrowing. L2-L3: Small disc bulge. No central spinal canal or neural foraminal stenosis. L3-L4: Annular fissure in the right foraminal zone. No central spinal canal or neural foraminal stenosis. L4-L5: Small disc bulge with central annular fissure. No central spinal canal or neural foraminal stenosis. L5-S1: No significant disc herniation. No spinal canal stenosis or neural foraminal narrowing. IMPRESSION: 1. Incomplete burst fracture at L1 with approximately 50% height loss and 2 mm retropulsion with diffuse bone marrow edema, resulting in mild spinal canal stenosis at L1.  Electronically signed by: Franky Stanford MD 05/28/2024 08:55 PM EDT RP Workstation: [id number]    Awake, alert, oriented  Speech fluent, appropriate  BUE/BLE 5/5 SILTx4    ASSESSMENT:  73 y.o. with L1 incomplete burst fracture    PLAN: -Pt to f/u outpatient. He has a strong preference to see Dr. Joshua, I will try to facilitate this via our clinic staff.  -Continue TLSO when OOB -Pain control -Call w/ questions/concerns.   Camie Pickle, [facility]

## 2024-05-29 NOTE — Progress Notes (Signed)
 Progress Note   Patient: Dwayne Cross FMW:[id number] DOB: Feb 03, 1951 DOA: 05/26/2024     0 DOS: the patient was seen and examined on 05/29/2024   Brief hospital course:  CC: low back pain  HPI: Dwayne Cross is a 73 y.o. male with medical history significant for HTN, prostate cancer who presented to the ED for evaluation of back pain after a fall as he was working on a tree house at home, fell from height of about 8 feet, landed directly on his back. Pt had acute onset of low back pain, denied head injury or loss of consciousness.  He denied any new leg weakness or numbness/tingling radiating down the legs.  Extensive imaging was obtained in the ED. Patient was found to have an acute L1 compression fracture.  Neurosurgery was consulted in the ED, recommended conservative management with TLSO brace, pain control, PT/OT evaluations and outpatient follow up.  Due to intractable pain and inability to ambulate safely, pt was admitted to the hospital.   Assessment and Plan:  Acute mildly comminuted L1 vertebral body compression fracture: Occurring after falling approximately 8 feet when the floor gave away in the tree house he was standing in.  Approximately 50% vertebral body height loss noted on CT.  EDP spoke with neurosurgery who recommended TLSO and outpatient follow-up.  Pain has been uncontrolled and patient unable to ambulate safely therefore admission was requested. --Neurosurgery consulted, appreciate recommendations - recommend outpatient follow up. Pt requests to go to Dr. Joshua, also his wife's neurosurgeon.  No plan for surgery or kypho at this time. --MRI lumbar spine yesterday -- see report --TLSO brace for OOB activity --Pain control: scheduled Tylenol , PRN oxycodone , PRN Robaxin, IV Dilaudid for breakthrough pain.  Oxycodone  increased on 10/2 to 10-15 mg Q4H PRN  --Add Lidocaine  patches --PT/OT evaluations -- Home Health recommended --Outpatient Neurosurgery follow up to  be arranged with repeat standing x-rays   Mild AKI - POA, resolved with IV fluids. Cr 1.40 on admission is improved to 1.06 after 500 LR bolus on admission. --On home ARB/hydrochlorothiazide    Hypertension: BP's controlled. --On home ARB/hydrochlorothiazide    Prostate cancer: Patient is scheduled for prostatectomy on 07/02/2024 with Dr. Renda.   Depression: --Continue Wellbutrin   Left thyroid  nodule: A 17 mm left thyroid  nodule was noted on CT imaging.   --Recommend outpatient thyroid  U/S --Follow up with PCP        Subjective: Pt awake resting in bed when seen this AM.  He reports 9/10 pain short while ago, while standing.  When up with walker, he still can't tolerate even taking one hand off the walker, due to pain.  Denies radiation down the leg.  Feels more pain right of midline today.  Denies leg weakness when standing, no numbness or tingling.  Had been given 15 mg oxycodone  about 40 minutes prior, not yet really effective.    Physical Exam: Vitals:   05/28/24 2356 05/29/24 0433 05/29/24 0800 05/29/24 1209  BP: (!) 140/60 128/84 128/85 125/88  Pulse: 63 82 70 73  Resp:   18 18  Temp: 97.9 F (36.6 C) 98.3 F (36.8 C) 98.2 F (36.8 C) 98.3 F (36.8 C)  TempSrc:   Oral Oral  SpO2: 97% 94% 95% 96%  Weight:      Height:       General exam: awake, alert, no acute distress, laying flat in bed HEENT: moist mucus membranes, hearing grossly normal  Respiratory system: on room air, normal  respiratory effort. Cardiovascular system: RRR, no pedal edema.   Gastrointestinal system: soft, NT, ND Central nervous system: A&O x 3. no gross focal neurologic deficits, normal speech Musculoskeletal system: not wearing brace in bed Skin: dry, intact, normal temperature Psychiatry: normal mood, congruent affect, judgement and insight appear normal   Data Reviewed: No new labs today Notable labs 10/1 -- Cr improved 1.40 >> 1.06, Ca 8.7, otherwise normal BMP and CBC  Family  Communication: wife at bedside on rounds 10/1, 10/2, none present today on rounds  Disposition: Status is: Observation. Pt remains admitted due to uncontrolled pain. Requiring IV meds.    Planned Discharge Destination: Home with Home Health    Time spent: 35 minutes  Author: Burnard DELENA Cunning, DO 05/29/2024 2:58 PM  For on call review [email or web address].

## 2024-05-29 NOTE — Progress Notes (Signed)
 Physical Therapy Treatment Patient Details Name: Dwayne Cross MRN: [id number] DOB: 09-11-1950 Today's Date: 05/29/2024   History of Present Illness 73 yo male s/p fall ~ 8 ft with back pain CT (+) compression fx L1  PMH HTN prostate CA    PT Comments  Patient progressing with mobility noting more confident with RW and able to take hands off during transitions with pain a little better controlled.  Also able to don brace on EOB with min A.  Reviewed stair technique and discussed car transfers as well.  Feel stable for home when medically ready.  HHPT at d/c recommended.     If plan is discharge home, recommend the following: A little help with walking and/or transfers;Assist for transportation;A little help with bathing/dressing/bathroom;Help with stairs or ramp for entrance   Can travel by private vehicle        Equipment Recommendations  None recommended by PT    Recommendations for Other Services       Precautions / Restrictions Precautions Precautions: Fall;Back Required Braces or Orthoses: Spinal Brace Spinal Brace: Thoracolumbosacral orthotic;Applied in sitting position     Mobility  Bed Mobility     Rolling: Supervision, Used rails Sidelying to sit: Used rails, HOB elevated       General bed mobility comments: reported better pushing up this time (Though HOB up)    Transfers Overall transfer level: Needs assistance Equipment used: Rolling walker (2 wheels) Transfers: Sit to/from Stand Sit to Stand: Supervision                Ambulation/Gait Ambulation/Gait assistance: Supervision Gait Distance (Feet): 140 Feet Assistive device: Rolling walker (2 wheels) Gait Pattern/deviations: Step-through pattern, Decreased stride length       General Gait Details: no physical assist needed   Stairs         General stair comments: reviewed sequence on stairs though did not practice again   Wheelchair Mobility     Tilt Bed    Modified Rankin  (Stroke Patients Only)       Balance     Sitting balance-Leahy Scale: Good       Standing balance-Leahy Scale: Fair Standing balance comment: abdle to let go of walker briefly today during transitions, no LOB.  Educated in car transfer sequence/technique with pt and wife present, safety with hand placement and small scoots as bringing in his legs and taking them out.                            Communication Communication Communication: No apparent difficulties  Cognition Arousal: Alert Behavior During Therapy: WFL for tasks assessed/performed   PT - Cognitive impairments: No apparent impairments                         Following commands: Intact      Cueing    Exercises      General Comments General comments (skin integrity, edema, etc.): pt able to don brace sitting EOB with min A      Pertinent Vitals/Pain Pain Assessment Pain Score: 7  Pain Location: lower to mid back Pain Descriptors / Indicators: Grimacing, Guarding Pain Intervention(s): Monitored during session    Home Living                          Prior Function  PT Goals (current goals can now be found in the care plan section) Progress towards PT goals: Progressing toward goals    Frequency    Min 3X/week      PT Plan      Co-evaluation              AM-PAC PT 6 Clicks Mobility   Outcome Measure  Help needed turning from your back to your side while in a flat bed without using bedrails?: A Little Help needed moving from lying on your back to sitting on the side of a flat bed without using bedrails?: A Little Help needed moving to and from a bed to a chair (including a wheelchair)?: None Help needed standing up from a chair using your arms (e.g., wheelchair or bedside chair)?: None Help needed to walk in hospital room?: A Little Help needed climbing 3-5 steps with a railing? : A Little 6 Click Score: 20    End of Session Equipment  Utilized During Treatment: Gait belt;Back brace Activity Tolerance: Patient tolerated treatment well Patient left: with family/visitor present;with nursing/sitter in room;in chair (sitting in chair at bedside)   PT Visit Diagnosis: Other abnormalities of gait and mobility (R26.89);Pain;Difficulty in walking, not elsewhere classified (R26.2) Pain - part of body:  (back)     Time: 8857-8794 PT Time Calculation (min) (ACUTE ONLY): 23 min  Charges:    $Gait Training: 8-22 mins $Self Care/Home Management: 8-22 PT General Charges $$ ACUTE PT VISIT: 1 Visit                     [name], PT Acute Rehabilitation Services Office:[phone] 05/29/2024    [name] 05/29/2024, 1:12 PM

## 2024-05-30 DIAGNOSIS — S32010A Wedge compression fracture of first lumbar vertebra, initial encounter for closed fracture: Secondary | ICD-10-CM | POA: Diagnosis not present

## 2024-05-30 LAB — BASIC METABOLIC PANEL WITH GFR
Anion gap: 9 (ref 5–15)
BUN: 16 mg/dL (ref 8–23)
CO2: 29 mmol/L (ref 22–32)
Calcium: 9.1 mg/dL (ref 8.9–10.3)
Chloride: 98 mmol/L (ref 98–111)
Creatinine, Ser: 1.07 mg/dL (ref 0.61–1.24)
GFR, Estimated: >60 mL/min (ref >60)
Glucose, Bld: 110 mg/dL — ABNORMAL HIGH (ref 70–99)
Potassium: 3.7 mmol/L (ref 3.5–5.1)
Sodium: 136 mmol/L (ref 135–145)

## 2024-05-30 MED ORDER — MAGNESIUM HYDROXIDE 400 MG/5ML PO SUSP
15.0000 mL | Freq: Every day | ORAL | Status: DC | PRN
  Administered 2024-05-30 – 2024-06-01 (×2): 15 mL via ORAL
  Filled 2024-05-30 (×3): qty 30

## 2024-05-30 MED ORDER — HYDROMORPHONE HCL 2 MG PO TABS
2.0000 mg | ORAL_TABLET | ORAL | Status: DC | PRN
  Administered 2024-05-30 – 2024-05-31 (×5): 4 mg via ORAL
  Filled 2024-05-30 (×6): qty 2

## 2024-05-30 MED ORDER — BISACODYL 10 MG RE SUPP
10.0000 mg | Freq: Every day | RECTAL | Status: DC | PRN
  Administered 2024-05-31: 10 mg via RECTAL
  Filled 2024-05-30: qty 1

## 2024-05-30 MED ORDER — SENNOSIDES-DOCUSATE SODIUM 8.6-50 MG PO TABS
1.0000 | ORAL_TABLET | Freq: Two times a day (BID) | ORAL | Status: DC
  Administered 2024-05-30 – 2024-06-01 (×5): 1 via ORAL
  Filled 2024-05-30 (×5): qty 1

## 2024-05-30 MED ORDER — POLYETHYLENE GLYCOL 3350 17 G PO PACK
17.0000 g | PACK | Freq: Every day | ORAL | Status: DC | PRN
  Administered 2024-05-30: 17 g via ORAL
  Filled 2024-05-30: qty 1

## 2024-05-30 MED ORDER — BISACODYL 5 MG PO TBEC
5.0000 mg | DELAYED_RELEASE_TABLET | Freq: Every day | ORAL | Status: DC | PRN
  Administered 2024-05-30 – 2024-05-31 (×2): 5 mg via ORAL
  Filled 2024-05-30 (×2): qty 1

## 2024-05-30 NOTE — Progress Notes (Signed)
 Progress Note   Patient: Dwayne Cross FMW:[id number] DOB: [birth year] DOA: 05/26/2024     0 DOS: the patient was seen and examined on 05/30/2024   Brief hospital course:  CC: low back pain  HPI: Dwayne Cross is a 73 y.o. male with medical history significant for HTN, prostate cancer who presented to the ED for evaluation of back pain after a fall as he was working on a tree house at home, fell from height of about 8 feet, landed directly on his back. Pt had acute onset of low back pain, denied head injury or loss of consciousness.  He denied any new leg weakness or numbness/tingling radiating down the legs.  Extensive imaging was obtained in the ED. Patient was found to have an acute L1 compression fracture.  Neurosurgery was consulted in the ED, recommended conservative management with TLSO brace, pain control, PT/OT evaluations and outpatient follow up.  Due to intractable pain and inability to ambulate safely, pt was admitted to the hospital.   Assessment and Plan:  Acute mildly comminuted L1 vertebral body compression fracture: Occurring after falling approximately 8 feet when the floor gave away in the tree house he was standing in.  Approximately 50% vertebral body height loss noted on CT.  EDP spoke with neurosurgery who recommended TLSO and outpatient follow-up.  Pain has been uncontrolled and patient unable to ambulate safely therefore admission was requested. --Neurosurgery consulted, appreciate recommendations - recommend outpatient follow up. Pt requests to go to Dr. Joshua, also his wife's neurosurgeon.  No plan for surgery or kypho at this time. --MRI lumbar spine 10/2 -- see report --TLSO brace for OOB activity --Pain control:  Scheduled Tylenol  Change oxycodone  to PO Dilaudid 2-4 mg Q4H PRN PRN Robaxin IV Dilaudid for breakthrough pain Lidocaine  patches --PT/OT evaluations -- Home Health recommended --Outpatient Neurosurgery follow up to be arranged with repeat  standing x-rays.    Opioid-induced constipation --Bowel regimen ordered   Mild AKI - POA, resolved with IV fluids. 10/4 - renal function stable Cr 1.40 on admission is improved to 1.06 after 500 LR bolus on admission. --Monitor BMP --On home ARB/hydrochlorothiazide    Hypertension: BP's controlled. --On home ARB/hydrochlorothiazide    Prostate cancer: Patient is scheduled for prostatectomy on 07/02/2024 with Dr. Renda.   Depression: --Continue Wellbutrin   Left thyroid  nodule: A 17 mm left thyroid  nodule was noted on CT imaging.   --Recommend outpatient thyroid  U/S --Follow up with PCP        Subjective: Pt awake resting in bed when seen this AM, wife at bedside.  He reports persistent back pain, poorly controlled.  Oxycodone  has not been very effective in lowering his pain levels so far.  Only get significant relief from IV Dilaudid.  Discussed trial of PO Dilaudid today, pt agreeable.  Wife was able to assist pt with showering earlier this AM.  He had severe pain episode in the shower.       Physical Exam: Vitals:   05/29/24 2044 05/29/24 2330 05/30/24 0340 05/30/24 0843  BP: 132/87 121/76 125/89 130/89  Pulse: 72 66 65 65  Resp: 20 20 20    Temp: 99.4 F (37.4 C) 98.2 F (36.8 C) 98.3 F (36.8 C) 98.4 F (36.9 C)  TempSrc: Oral Oral Oral   SpO2: 93% 91% 95% 97%  Weight:      Height:       General exam: awake, alert, no acute distress, laying flat in bed HEENT: moist mucus membranes, hearing grossly  normal  Respiratory system: on room air, normal respiratory effort. Cardiovascular system: RRR, no pedal edema.   Central nervous system: A&O x 3. no gross focal neurologic deficits, normal speech Musculoskeletal system: not wearing brace in bed Skin: dry, intact, normal temperature Psychiatry: normal mood, congruent affect, judgement and insight appear normal   Data Reviewed:   Notable labs -- normal BMP other than glucose 110  Family Communication: wife at  bedside on rounds   Disposition: Status is: Observation. Pt remains admitted due to uncontrolled pain. Requiring IV meds.    Planned Discharge Destination: Home with Home Health    Time spent: 35 minutes  Author: Burnard Dwayne Cunning, DO 05/30/2024 10:35 AM  For on call review [email or web address].

## 2024-05-30 NOTE — Progress Notes (Signed)
 Patient still c/o back pain, requesting pain meds. States that his Oxy prn is not controlling his pain very well and is requesting prn dilaudid IV. Provider was notified.

## 2024-05-30 NOTE — Plan of Care (Signed)

## 2024-05-31 ENCOUNTER — Other Ambulatory Visit (HOSPITAL_COMMUNITY): Payer: Self-pay

## 2024-05-31 MED ORDER — METHOCARBAMOL 750 MG PO TABS
750.0000 mg | ORAL_TABLET | Freq: Three times a day (TID) | ORAL | 0 refills | Status: DC | PRN
  Filled 2024-05-31: qty 30, 10d supply, fill #0

## 2024-05-31 MED ORDER — SENNOSIDES-DOCUSATE SODIUM 8.6-50 MG PO TABS
1.0000 | ORAL_TABLET | Freq: Two times a day (BID) | ORAL | 0 refills | Status: DC
  Filled 2024-05-31: qty 30, 15d supply, fill #0

## 2024-05-31 MED ORDER — POLYETHYLENE GLYCOL 3350 17 G PO PACK
17.0000 g | PACK | Freq: Two times a day (BID) | ORAL | Status: DC
  Administered 2024-05-31 – 2024-06-01 (×3): 17 g via ORAL
  Filled 2024-05-31 (×3): qty 1

## 2024-05-31 MED ORDER — LIDOCAINE 5 % EX PTCH
1.0000 | MEDICATED_PATCH | CUTANEOUS | 0 refills | Status: AC
  Filled 2024-05-31: qty 30, 30d supply, fill #0

## 2024-05-31 MED ORDER — BISACODYL 10 MG RE SUPP
10.0000 mg | Freq: Every day | RECTAL | 0 refills | Status: DC | PRN
  Filled 2024-05-31: qty 12, 12d supply, fill #0

## 2024-05-31 MED ORDER — POLYETHYLENE GLYCOL 3350 17 GM/SCOOP PO POWD
17.0000 g | Freq: Two times a day (BID) | ORAL | 0 refills | Status: DC
  Filled 2024-05-31: qty 238, 7d supply, fill #0

## 2024-05-31 MED ORDER — HYDROMORPHONE HCL 2 MG PO TABS
2.0000 mg | ORAL_TABLET | ORAL | 0 refills | Status: DC | PRN
  Filled 2024-05-31: qty 30, 5d supply, fill #0

## 2024-05-31 MED ORDER — FLEET ENEMA RE ENEM
1.0000 | ENEMA | Freq: Once | RECTAL | Status: AC
  Administered 2024-05-31: 1 via RECTAL
  Filled 2024-05-31: qty 1

## 2024-05-31 MED ORDER — ACETAMINOPHEN 500 MG PO TABS: 1000.0000 mg | ORAL_TABLET | Freq: Three times a day (TID) | ORAL | Status: AC

## 2024-05-31 MED ORDER — BISACODYL 5 MG PO TBEC
5.0000 mg | DELAYED_RELEASE_TABLET | Freq: Every day | ORAL | 0 refills | Status: DC | PRN
  Filled 2024-05-31: qty 30, 30d supply, fill #0

## 2024-05-31 NOTE — Plan of Care (Signed)

## 2024-05-31 NOTE — Progress Notes (Signed)
 Med details completed.  Primary RN updated to review and update as needed.

## 2024-05-31 NOTE — Discharge Summary (Incomplete)
 Physician Discharge Summary   Patient: Dwayne Cross MRN: [id number] DOB: 1951/06/25  Admit date:     05/26/2024  Discharge date: {dischdate:26783}  Discharge Physician: Burnard DELENA Cunning   PCP: Seabron Lenis, MD   Recommendations at discharge:  {Tip this will not be part of the note when signed- Example include specific recommendations for outpatient follow-up, pending tests to follow-up on. (Optional):26781}  ***  Discharge Diagnoses: Principal Problem:   Closed compression fracture of L1 lumbar vertebra, initial encounter (HCC) Active Problems:   Left thyroid  nodule   Hypertension   Depression  Resolved Problems:   * No resolved hospital problems. *  Hospital Course: Dwayne Cross is a 73 y.o. male with medical history significant for HTN, prostate cancer who is admitted with an acute L1 vertebral compression fracture after a fall at home.  Assessment and Plan: No notes have been filed under this hospital service. Service: Hospitalist     {Tip this will not be part of the note when signed Body mass index is 29.47 kg/m. , ,  (Optional):26781}  {(NOTE) Pain control PDMP Statment (Optional):26782} Consultants: *** Procedures performed: ***  Disposition: {Plan; Disposition:26390} Diet recommendation:  {Diet_Plan:26776} DISCHARGE MEDICATION: Allergies as of 05/31/2024       Reactions   Morphine And Codeine Other (See Comments)   Anxious, figity     Med Rec must be completed prior to using this SMARTLINK***       Follow-up Information     [facility] Follow up.   Why: [facility] will provide home health PT. Contact information: [address] [place] [address] [phone]                Discharge Exam: Fredricka Weights   05/26/24 2305  Weight: 86.6 kg   ***  Condition at discharge: {DC Condition:26389}  The results of significant diagnostics from this hospitalization (including imaging, microbiology, ancillary and  laboratory) are listed below for reference.   Imaging Studies: MR LUMBAR SPINE WO CONTRAST Result Date: 05/28/2024 EXAM: MRI LUMBAR SPINE 05/28/2024 05:43:00 PM TECHNIQUE: Multiplanar multisequence MRI of the lumbar spine was performed without the administration of intravenous contrast. COMPARISON: CT lumbar spine 05/26/2024. CLINICAL HISTORY: Spine fracture, lumbar, traumatic. FINDINGS: BONES AND ALIGNMENT: There is an incomplete burst fracture at L1 with approximately 50% height loss and 2 mm of retropulsion and diffuse bone marrow edema. Superior endplate schmorl's nodes are present at T12 and L3. Alignment is altered at L1 due to the fracture and retropulsion. SPINAL CORD: The conus terminates normally. There is mild spinal canal stenosis at L1 due to retropulsion. SOFT TISSUES: No paraspinal mass. T12-L1: Superior endplate schmorl's nodes at T12. The L1 incomplete burst fracture with retropulsion and edema affects this level. Mild spinal canal stenosis at L1 due to retropulsion. L1-L2: No significant disc herniation. No neural foraminal narrowing. L2-L3: Small disc bulge. No central spinal canal or neural foraminal stenosis. L3-L4: Annular fissure in the right foraminal zone. No central spinal canal or neural foraminal stenosis. L4-L5: Small disc bulge with central annular fissure. No central spinal canal or neural foraminal stenosis. L5-S1: No significant disc herniation. No spinal canal stenosis or neural foraminal narrowing. IMPRESSION: 1. Incomplete burst fracture at L1 with approximately 50% height loss and 2 mm retropulsion with diffuse bone marrow edema, resulting in mild spinal canal stenosis at L1. Electronically signed by: Franky Stanford MD 05/28/2024 08:55 PM EDT RP Workstation: [id number]   CT T-SPINE NO CHARGE Result Date: 05/26/2024 CLINICAL DATA:  Trauma fall  from 8 feet EXAM: CT Thoracic and Lumbar spine without contrast TECHNIQUE: Multiplanar CT images of the thoracic and lumbar spine were  reconstructed from contemporary CT of the Chest, Abdomen, and Pelvis. RADIATION DOSE REDUCTION: This exam was performed according to the departmental dose-optimization program which includes automated exposure control, adjustment of the mA and/or kV according to patient size and/or use of iterative reconstruction technique. CONTRAST:  None or No additional COMPARISON:  Same day CT 05/26/2024 FINDINGS: CT THORACIC SPINE FINDINGS Alignment: Within normal limits. Vertebrae: Mild superior endplate deformity at T12 with Schmorl's node, probably chronic. Vertebral body heights are maintained. Paraspinal and other soft tissues: No acute finding Disc levels: Multilevel degenerative osteophytes. No abnormal disc space widening. CT LUMBAR SPINE FINDINGS Segmentation: 5 lumbar type vertebrae. Alignment: Normal. Vertebrae: Acute mildly comminuted fracture involves the L1 vertebral body, including the superior endplate. Approaching 50% loss of vertebral body height anteriorly. Mild 2 mm retropulsion of the posterior vertebral body, without significant canal stenosis. Fracture does not appear to involve the pedicles. Partial midline cleft at the spinous process of L1. Old appearing deformities involving the right transverse process of L2, L3 and L4. Remaining vertebra demonstrate normal stature. Schmorl's node superior endplate L3. Paraspinal and other soft tissues: Paravertebral soft tissue swelling greatest at L1 level. Disc levels: Mild disc space narrowing at L1-L2 with suspected disc bulge. Disc bulges at L3-L4 and L4-L5. Suspect at least mild canal stenosis at L4-L5. Mild multilevel facet degenerative change. IMPRESSION: 1. Acute mildly comminuted moderate compression fracture involving the L1 vertebral body with close to 50% loss of vertebral body height anteriorly. Mild 2 mm retropulsion of the posterior vertebral body without significant canal stenosis. 2. Mild superior endplate deformity at T12 with Schmorl's node,  probably chronic. 3. Old appearing deformities involving the right transverse process of L2, L3 and L4. 4. Multilevel degenerative changes of the thoracic and lumbar spine. 5. No definite acute osseous abnormality of the thoracic spine Electronically Signed   By: Luke Bun M.D.   On: 05/26/2024 18:44   CT L-SPINE NO CHARGE Result Date: 05/26/2024 CLINICAL DATA:  Trauma fall from 8 feet EXAM: CT Thoracic and Lumbar spine without contrast TECHNIQUE: Multiplanar CT images of the thoracic and lumbar spine were reconstructed from contemporary CT of the Chest, Abdomen, and Pelvis. RADIATION DOSE REDUCTION: This exam was performed according to the departmental dose-optimization program which includes automated exposure control, adjustment of the mA and/or kV according to patient size and/or use of iterative reconstruction technique. CONTRAST:  None or No additional COMPARISON:  Same day CT 05/26/2024 FINDINGS: CT THORACIC SPINE FINDINGS Alignment: Within normal limits. Vertebrae: Mild superior endplate deformity at T12 with Schmorl's node, probably chronic. Vertebral body heights are maintained. Paraspinal and other soft tissues: No acute finding Disc levels: Multilevel degenerative osteophytes. No abnormal disc space widening. CT LUMBAR SPINE FINDINGS Segmentation: 5 lumbar type vertebrae. Alignment: Normal. Vertebrae: Acute mildly comminuted fracture involves the L1 vertebral body, including the superior endplate. Approaching 50% loss of vertebral body height anteriorly. Mild 2 mm retropulsion of the posterior vertebral body, without significant canal stenosis. Fracture does not appear to involve the pedicles. Partial midline cleft at the spinous process of L1. Old appearing deformities involving the right transverse process of L2, L3 and L4. Remaining vertebra demonstrate normal stature. Schmorl's node superior endplate L3. Paraspinal and other soft tissues: Paravertebral soft tissue swelling greatest at L1  level. Disc levels: Mild disc space narrowing at L1-L2 with suspected disc bulge. Disc bulges  at L3-L4 and L4-L5. Suspect at least mild canal stenosis at L4-L5. Mild multilevel facet degenerative change. IMPRESSION: 1. Acute mildly comminuted moderate compression fracture involving the L1 vertebral body with close to 50% loss of vertebral body height anteriorly. Mild 2 mm retropulsion of the posterior vertebral body without significant canal stenosis. 2. Mild superior endplate deformity at T12 with Schmorl's node, probably chronic. 3. Old appearing deformities involving the right transverse process of L2, L3 and L4. 4. Multilevel degenerative changes of the thoracic and lumbar spine. 5. No definite acute osseous abnormality of the thoracic spine Electronically Signed   By: Luke Bun M.D.   On: 05/26/2024 18:44   CT CHEST ABDOMEN PELVIS W CONTRAST Result Date: 05/26/2024 CLINICAL DATA:  [place] out of tree house EXAM: CT CHEST, ABDOMEN, AND PELVIS WITH CONTRAST TECHNIQUE: Multidetector CT imaging of the chest, abdomen and pelvis was performed following the standard protocol during bolus administration of intravenous contrast. RADIATION DOSE REDUCTION: This exam was performed according to the departmental dose-optimization program which includes automated exposure control, adjustment of the mA and/or kV according to patient size and/or use of iterative reconstruction technique. CONTRAST:  65mL OMNIPAQUE IOHEXOL 350 MG/ML SOLN COMPARISON:  Radiograph 05/26/2024 FINDINGS: CT CHEST FINDINGS Cardiovascular: Mild aortic atherosclerosis. No aneurysm. Normal aortic contour. Multi-vessel coronary vascular calcification. Normal cardiac size. No pericardial effusion Mediastinum/Nodes: Patent trachea. Left thyroid  nodule measuring 17 mm. No suspicious lymph nodes. Esophagus within normal limits Lungs/Pleura: Negative for pleural effusion or pneumothorax. Hazy areas of probable dependent atelectasis. No focal airspace disease  Musculoskeletal: Sternum appears intact. See separately dictated thoracic CT CT ABDOMEN PELVIS FINDINGS Hepatobiliary: No focal liver abnormality is seen. No gallstones, gallbladder wall thickening, or biliary dilatation. Pancreas: Unremarkable. No pancreatic ductal dilatation or surrounding inflammatory changes. Spleen: Normal in size without focal abnormality. Adrenals/Urinary Tract: Adrenal glands are unremarkable. Kidneys are normal, without renal calculi, focal lesion, or hydronephrosis. Bladder is unremarkable. Stomach/Bowel: Stomach is within normal limits. No evidence of bowel wall thickening, distention, or inflammatory changes. Vascular/Lymphatic: Aortic atherosclerosis. No enlarged abdominal or pelvic lymph nodes. Reproductive: Prostate is slightly enlarged Other: Negative for pelvic effusion or free air. Small fat containing left inguinal hernia Musculoskeletal: Old right inferior pubic ramus fracture. Remote right acetabular fracture. See separately dictated lumbar CT IMPRESSION: 1. No CT evidence for acute intrathoracic, intra-abdominal, or intrapelvic abnormality. See separately dictated spine CT for additional findings 2. 17 mm left thyroid  nodule. Recommend thyroid  US  (ref: J Am Coll Radiol. 2015 Feb;12(2): 143-50).This should be performed non emergently 3. Aortic atherosclerosis. Aortic Atherosclerosis (ICD10-I70.0). Electronically Signed   By: Luke Bun M.D.   On: 05/26/2024 18:31   CT HEAD WO CONTRAST Result Date: 05/26/2024 CLINICAL DATA:  Fall 8 feet from tree house with head/neck trauma. EXAM: CT HEAD WITHOUT CONTRAST CT CERVICAL SPINE WITHOUT CONTRAST TECHNIQUE: Multidetector CT imaging of the head and cervical spine was performed following the standard protocol without intravenous contrast. Multiplanar CT image reconstructions of the cervical spine were also generated. RADIATION DOSE REDUCTION: This exam was performed according to the departmental dose-optimization program which  includes automated exposure control, adjustment of the mA and/or kV according to patient size and/or use of iterative reconstruction technique. COMPARISON:  Head CT 10/09/2019 FINDINGS: CT HEAD FINDINGS Brain: Ventricles, cisterns and other CSF spaces are normal. No mass, mass effect, shift of midline structures or acute hemorrhage. Remainder the exam is unchanged. Vascular: No hyperdense vessel or unexpected calcification. Skull: No acute fracture. Sinuses/Orbits: Orbits are normal. Air-fluid level  over the left maxillary sinus. Minimal opacification over the ethmoid air cells. Other: None. CT CERVICAL SPINE FINDINGS Alignment: Normal. Skull base and vertebrae: Moderate spondylosis of the cervical spine to include uncovertebral joint spurring and facet arthropathy. Vertebral body heights are maintained. No acute fracture. Minimal bilateral neural foraminal narrowing at the C5-6 level and C6-7 levels. Soft tissues and spinal canal: No prevertebral fluid or swelling. No visible canal hematoma. Disc levels: Disc space narrowing at the C5-6 and C6-7 levels as well as the C7-T1 level. Upper chest: No acute findings. Other: None. IMPRESSION: 1. No acute brain injury. 2. No acute cervical spine injury. 3. 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 Minimal bilateral neural foraminal narrowing at the C5-6 and C6-7 levels. Electronically Signed   By: Toribio Agreste M.D.   On: 05/26/2024 18:31   CT CERVICAL SPINE WO CONTRAST Result Date: 05/26/2024 CLINICAL DATA:  Fall 8 feet from tree house with head/neck trauma. EXAM: CT HEAD WITHOUT CONTRAST CT CERVICAL SPINE WITHOUT CONTRAST TECHNIQUE: Multidetector CT imaging of the head and cervical spine was performed following the standard protocol without intravenous contrast. Multiplanar CT image reconstructions of the cervical spine were also generated. RADIATION DOSE REDUCTION: This exam was performed according to the  departmental dose-optimization program which includes automated exposure control, adjustment of the mA and/or kV according to patient size and/or use of iterative reconstruction technique. COMPARISON:  Head CT 10/09/2019 FINDINGS: CT HEAD FINDINGS Brain: Ventricles, cisterns and other CSF spaces are normal. No mass, mass effect, shift of midline structures or acute hemorrhage. Remainder the exam is unchanged. Vascular: No hyperdense vessel or unexpected calcification. Skull: No acute fracture. Sinuses/Orbits: Orbits are normal. Air-fluid level over the left maxillary sinus. Minimal opacification over the ethmoid air cells. Other: None. CT CERVICAL SPINE FINDINGS Alignment: Normal. Skull base and vertebrae: Moderate spondylosis of the cervical spine to include uncovertebral joint spurring and facet arthropathy. Vertebral body heights are maintained. No acute fracture. Minimal bilateral neural foraminal narrowing at the C5-6 level and C6-7 levels. Soft tissues and spinal canal: No prevertebral fluid or swelling. No visible canal hematoma. Disc levels: Disc space narrowing at the C5-6 and C6-7 levels as well as the C7-T1 level. Upper chest: No acute findings. Other: None. IMPRESSION: 1. No acute brain injury. 2. No acute cervical spine injury. 3. 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 Minimal bilateral neural foraminal narrowing at the C5-6 and C6-7 levels. Electronically Signed   By: Toribio Agreste M.D.   On: 05/26/2024 18:31   DG Pelvis Portable Result Date: 05/26/2024 CLINICAL DATA:  Fall. EXAM: PORTABLE PELVIS 1-2 VIEWS COMPARISON:  None Available. FINDINGS: There is no evidence of pelvic fracture or diastasis. No pelvic bone lesions are seen. IMPRESSION: Negative. Electronically Signed   By: Lynwood Landy Raddle M.D.   On: 05/26/2024 17:33   DG Chest Port 1 View Result Date: 05/26/2024 CLINICAL DATA:  Fall. EXAM: PORTABLE CHEST 1 VIEW COMPARISON:  October 28, 2023. FINDINGS: Stable cardiomediastinal silhouette. Both lungs are clear. The visualized skeletal structures are unremarkable. IMPRESSION: No active disease. Electronically Signed   By: Lynwood Landy Raddle M.D.   On: 05/26/2024 17:32    Microbiology: Results for orders placed or performed during the hospital encounter of 07/10/19  Novel Coronavirus, NAA (Hosp order, Send-out to Ref Lab; TAT 18-24 hrs     Status: None   Collection Time: 07/10/19  12:27 PM   Specimen: Nasopharyngeal Swab; Respiratory  Result Value Ref Range Status   SARS-CoV-2, NAA NOT DETECTED NOT DETECTED Final    Comment: (NOTE) This nucleic acid amplification test was developed and its performance characteristics determined by [employer]. Nucleic acid amplification tests include PCR and TMA. This test has not been FDA cleared or approved. This test has been authorized by FDA under an Emergency Use Authorization (EUA). This test is only authorized for the duration of time the declaration that circumstances exist justifying the authorization of the emergency use of in vitro diagnostic tests for detection of SARS-CoV-2 virus and/or diagnosis of COVID-19 infection under section 564(b)(1) of the Act, 21 U.S.C. 639aaa-6(a) (1), unless the authorization is terminated or revoked sooner. When diagnostic testing is negative, the possibility of a false negative result should be considered in the context of a patient's recent exposures and the presence of clinical signs and symptoms consistent with COVID-19. An individual without symptoms of COVID- 19 and who is not shedding SARS-CoV-2 vi rus would expect to have a negative (not detected) result in this assay. Performed At: [facility] [address] [place], [place] [id number] Jennette Shorter MD Ey:[id number]    Coronavirus Source NASOPHARYNGEAL  Final    Comment: Performed at [facility], [address], [place], [place] [address]     Labs: CBC: Recent Labs  Lab 05/26/24 1712 05/26/24 1721 05/27/24 0439  WBC 7.4  --  8.1  HGB 15.6 15.0 15.2  HCT 44.2 44.0 42.5  MCV 92.5  --  92.2  PLT 188  --  152   Basic Metabolic Panel: Recent Labs  Lab 05/26/24 1712 05/26/24 1721 05/27/24 0439 05/30/24 0714  NA 136 139 136 136  K 4.1 4.0 3.8 3.7  CL 101 101 101 98  CO2 25  --  25 29  GLUCOSE 108* 108* 121* 110*  BUN 23 24* 20 16  CREATININE 1.40* 1.40* 1.06 1.07  CALCIUM 9.1  --  8.7* 9.1   Liver Function Tests: Recent Labs  Lab 05/26/24 1712  AST 25  ALT 15  ALKPHOS 47  BILITOT 0.9  PROT 6.8  ALBUMIN 4.0   CBG: No results for input(s): GLUCAP in the last 168 hours.  Discharge time spent: {LESS THAN/GREATER UYJW:73611} 30 minutes.  Signed: Burnard DELENA Cunning, DO [facility] 05/31/2024

## 2024-06-01 ENCOUNTER — Other Ambulatory Visit (HOSPITAL_COMMUNITY): Payer: Self-pay

## 2024-06-01 DIAGNOSIS — S32010A Wedge compression fracture of first lumbar vertebra, initial encounter for closed fracture: Secondary | ICD-10-CM | POA: Diagnosis not present

## 2024-06-01 NOTE — Discharge Summary (Addendum)
 Physician Discharge Summary   Patient: Dwayne Cross MRN: [id number] DOB: Dec 25, 1950  Admit date:     05/26/2024  Discharge date: 06/01/24  Discharge Physician: Deliliah Room   PCP: Seabron Lenis, MD   Recommendations at discharge:    F/u with PCP in one week F/U with spine surgeon, Dr Joshua, On the scheduled appointment. Continue taking meds as presceribed.  Discharge Diagnoses: Principal Problem:   Closed compression fracture of L1 lumbar vertebra, initial encounter [facility]) Active Problems:   Left thyroid  nodule   Hypertension   Depression   Hospital Course:   Acute mildly comminuted L1 vertebral body compression fracture: Occurring after falling approximately 8 feet when the floor gave away in the tree house he was standing in.  Approximately 50% vertebral body height loss noted on CT.  EDP spoke with neurosurgery who recommended TLSO and outpatient follow-up.  Pain has been uncontrolled and patient unable to ambulate safely therefore admission was requested. --Neurosurgery consulted, appreciate recommendations - recommend outpatient follow up. Pt requests to go to Dr. Joshua, also his wife's neurosurgeon.  No plan for surgery or kypho at this time. --MRI lumbar spine 10/2 -- see report --TLSO brace for OOB activity --Pain control:  Prn analgesics PRN Robaxin Lidocaine  patches --PT/OT evaluations -- Home Health recommended --Outpatient Neurosurgery follow up to be arranged with repeat standing x-rays.     Opioid-induced constipation --Bowel regimen ordered   Mild AKI - POA, resolved with IV fluids.   Disposition: Home with HHS. Discussed with his wife at the bedside.  Consultants: Spine surgery Procedures performed: None  Disposition: Home health Diet recommendation:  Discharge Diet Orders (From admission, onward)     Start     Ordered   05/31/24 0000  Diet - low sodium heart healthy        05/31/24 1704           Regular diet DISCHARGE  MEDICATION: Allergies as of 06/01/2024       Reactions   Morphine And Codeine Other (See Comments)   Anxious, figity        Medication List     TAKE these medications    acetaminophen  500 MG tablet Commonly known as: TYLENOL  Take 2 tablets (1,000 mg total) by mouth every 8 (eight) hours.   alfuzosin 10 MG 24 hr tablet Commonly known as: UROXATRAL Take 10 mg by mouth daily.   bisacodyl 5 MG EC tablet Generic drug: bisacodyl Take 1 tablet (5 mg total) by mouth daily as needed (Constipation).   bisacodyl 10 MG suppository Commonly known as: DULCOLAX Place 1 suppository (10 mg total) rectally daily as needed for severe constipation.   buPROPion 300 MG 24 hr tablet Commonly known as: WELLBUTRIN XL Take 300 mg by mouth daily.   CHOLECALCIFEROL PO Take 1 tablet by mouth daily. Dosage unknown   fluticasone 50 MCG/ACT nasal spray Commonly known as: FLONASE Place 1 spray into both nostrils daily as needed for allergies.   HYDROmorphone 2 MG tablet Commonly known as: DILAUDID Take 1-2 tablets (2-4 mg total) by mouth every 4 (four) hours as needed for severe pain (pain score 7-10) or moderate pain (pain score 4-6).   ibuprofen 200 MG tablet Commonly known as: ADVIL Take 200-400 mg by mouth every 6 (six) hours as needed for mild pain (pain score 1-3).   lidocaine  5 % Commonly known as: LIDODERM  Place 1 patch onto the skin daily. Remove & Discard patch within 12 hours or as directed by MD  methocarbamol 750 MG tablet Commonly known as: ROBAXIN Take 1 tablet (750 mg total) by mouth every 8 (eight) hours as needed for muscle spasms.   multivitamin tablet Take 1 tablet by mouth daily.   polyethylene glycol powder 17 GM/SCOOP powder Commonly known as: GLYCOLAX/MIRALAX Dissolve 1 capful (17g) in 4-8 ounces of liquid and take by mouth 2 (two) times daily. (Take 17 g by mouth 2 (two) times daily. Dissolve 1 capful (17g) in 4-8 ounces of liquid and take by mouth 2 (two)  times daily.)   Stool Softener/Laxative 50-8.6 MG tablet Generic drug: senna-docusate Take 1 tablet by mouth 2 (two) times daily.   valsartan-hydrochlorothiazide 160-12.5 MG tablet Commonly known as: DIOVAN-HCT Take 1 tablet by mouth daily.        Follow-up Information     [facility] Follow up.   Why: [facility] will provide home health PT. Contact information: [address] [place] [address] [phone]         Seabron Lenis, MD. Schedule an appointment as soon as possible for a visit in 1 week(s).   Specialty: Family Medicine Contact information: [phone] [address] [place] [address] [phone]         Joshua Lenis Hamilton, MD. Schedule an appointment as soon as possible for a visit in 2 week(s).   Specialty: Neurosurgery Contact information: [address] [place] [address] [phone]                Discharge Exam: Fredricka Weights   05/26/24 2305  Weight: 86.6 kg   Constitutional: NAD, calm, comfortable Eyes: PERRL, lids and conjunctivae normal ENMT: Mucous membranes are moist. Posterior pharynx clear of any exudate or lesions.Normal dentition.  Neck: normal, supple, no masses, no thyromegaly Respiratory: clear to auscultation bilaterally, no wheezing, no crackles. Normal respiratory effort. No accessory muscle use.  Cardiovascular: Regular rate and rhythm, no murmurs / rubs / gallops. No extremity edema. 2+ pedal pulses. No carotid bruits.  Abdomen: no tenderness, no masses palpated. No hepatosplenomegaly. Bowel sounds positive.  Musculoskeletal: no clubbing / cyanosis. No joint deformity upper and lower extremities. Good ROM, no contractures. Normal muscle tone.  Skin: no rashes, lesions, ulcers. No induration Neurologic: CN 2-12 grossly intact. Sensation intact, DTR normal. Strength 5/5 x all 4 extremities.  Psychiatric: Normal judgment and insight. Alert and oriented x 3. Normal mood.     Condition at discharge: good  The results of significant diagnostics from this hospitalization (including imaging, microbiology, ancillary and laboratory) are listed below for reference.   Imaging Studies: MR LUMBAR SPINE WO CONTRAST Result Date: 05/28/2024 EXAM: MRI LUMBAR SPINE 05/28/2024 05:43:00 PM TECHNIQUE: Multiplanar multisequence MRI of the lumbar spine was performed without the administration of intravenous contrast. COMPARISON: CT lumbar spine 05/26/2024. CLINICAL HISTORY: Spine fracture, lumbar, traumatic. FINDINGS: BONES AND ALIGNMENT: There is an incomplete burst fracture at L1 with approximately 50% height loss and 2 mm of retropulsion and diffuse bone marrow edema. Superior endplate schmorl's nodes are present at T12 and L3. Alignment is altered at L1 due to the fracture and retropulsion. SPINAL CORD: The conus terminates normally. There is mild spinal canal stenosis at L1 due to retropulsion. SOFT TISSUES: No paraspinal mass. T12-L1: Superior endplate schmorl's nodes at T12. The L1 incomplete burst fracture with retropulsion and edema affects this level. Mild spinal canal stenosis at L1 due to retropulsion. L1-L2: No significant disc herniation. No neural foraminal narrowing. L2-L3: Small disc bulge. No central spinal canal or neural  foraminal stenosis. L3-L4: Annular fissure in the right foraminal zone. No central spinal canal or neural foraminal stenosis. L4-L5: Small disc bulge with central annular fissure. No central spinal canal or neural foraminal stenosis. L5-S1: No significant disc herniation. No spinal canal stenosis or neural foraminal narrowing. IMPRESSION: 1. Incomplete burst fracture at L1 with approximately 50% height loss and 2 mm retropulsion with diffuse bone marrow edema, resulting in mild spinal canal stenosis at L1. Electronically signed by: Franky Stanford MD 05/28/2024 08:55 PM EDT RP Workstation: [id number]   CT T-SPINE NO CHARGE Result Date: 05/26/2024 CLINICAL  DATA:  Trauma fall from 8 feet EXAM: CT Thoracic and Lumbar spine without contrast TECHNIQUE: Multiplanar CT images of the thoracic and lumbar spine were reconstructed from contemporary CT of the Chest, Abdomen, and Pelvis. RADIATION DOSE REDUCTION: This exam was performed according to the departmental dose-optimization program which includes automated exposure control, adjustment of the mA and/or kV according to patient size and/or use of iterative reconstruction technique. CONTRAST:  None or No additional COMPARISON:  Same day CT 05/26/2024 FINDINGS: CT THORACIC SPINE FINDINGS Alignment: Within normal limits. Vertebrae: Mild superior endplate deformity at T12 with Schmorl's node, probably chronic. Vertebral body heights are maintained. Paraspinal and other soft tissues: No acute finding Disc levels: Multilevel degenerative osteophytes. No abnormal disc space widening. CT LUMBAR SPINE FINDINGS Segmentation: 5 lumbar type vertebrae. Alignment: Normal. Vertebrae: Acute mildly comminuted fracture involves the L1 vertebral body, including the superior endplate. Approaching 50% loss of vertebral body height anteriorly. Mild 2 mm retropulsion of the posterior vertebral body, without significant canal stenosis. Fracture does not appear to involve the pedicles. Partial midline cleft at the spinous process of L1. Old appearing deformities involving the right transverse process of L2, L3 and L4. Remaining vertebra demonstrate normal stature. Schmorl's node superior endplate L3. Paraspinal and other soft tissues: Paravertebral soft tissue swelling greatest at L1 level. Disc levels: Mild disc space narrowing at L1-L2 with suspected disc bulge. Disc bulges at L3-L4 and L4-L5. Suspect at least mild canal stenosis at L4-L5. Mild multilevel facet degenerative change. IMPRESSION: 1. Acute mildly comminuted moderate compression fracture involving the L1 vertebral body with close to 50% loss of vertebral body height anteriorly. Mild  2 mm retropulsion of the posterior vertebral body without significant canal stenosis. 2. Mild superior endplate deformity at T12 with Schmorl's node, probably chronic. 3. Old appearing deformities involving the right transverse process of L2, L3 and L4. 4. Multilevel degenerative changes of the thoracic and lumbar spine. 5. No definite acute osseous abnormality of the thoracic spine Electronically Signed   By: Luke Bun M.D.   On: 05/26/2024 18:44   CT L-SPINE NO CHARGE Result Date: 05/26/2024 CLINICAL DATA:  Trauma fall from 8 feet EXAM: CT Thoracic and Lumbar spine without contrast TECHNIQUE: Multiplanar CT images of the thoracic and lumbar spine were reconstructed from contemporary CT of the Chest, Abdomen, and Pelvis. RADIATION DOSE REDUCTION: This exam was performed according to the departmental dose-optimization program which includes automated exposure control, adjustment of the mA and/or kV according to patient size and/or use of iterative reconstruction technique. CONTRAST:  None or No additional COMPARISON:  Same day CT 05/26/2024 FINDINGS: CT THORACIC SPINE FINDINGS Alignment: Within normal limits. Vertebrae: Mild superior endplate deformity at T12 with Schmorl's node, probably chronic. Vertebral body heights are maintained. Paraspinal and other soft tissues: No acute finding Disc levels: Multilevel degenerative osteophytes. No abnormal disc space widening. CT LUMBAR SPINE FINDINGS Segmentation: 5 lumbar type vertebrae. Alignment: Normal.  Vertebrae: Acute mildly comminuted fracture involves the L1 vertebral body, including the superior endplate. Approaching 50% loss of vertebral body height anteriorly. Mild 2 mm retropulsion of the posterior vertebral body, without significant canal stenosis. Fracture does not appear to involve the pedicles. Partial midline cleft at the spinous process of L1. Old appearing deformities involving the right transverse process of L2, L3 and L4. Remaining vertebra  demonstrate normal stature. Schmorl's node superior endplate L3. Paraspinal and other soft tissues: Paravertebral soft tissue swelling greatest at L1 level. Disc levels: Mild disc space narrowing at L1-L2 with suspected disc bulge. Disc bulges at L3-L4 and L4-L5. Suspect at least mild canal stenosis at L4-L5. Mild multilevel facet degenerative change. IMPRESSION: 1. Acute mildly comminuted moderate compression fracture involving the L1 vertebral body with close to 50% loss of vertebral body height anteriorly. Mild 2 mm retropulsion of the posterior vertebral body without significant canal stenosis. 2. Mild superior endplate deformity at T12 with Schmorl's node, probably chronic. 3. Old appearing deformities involving the right transverse process of L2, L3 and L4. 4. Multilevel degenerative changes of the thoracic and lumbar spine. 5. No definite acute osseous abnormality of the thoracic spine Electronically Signed   By: Luke Bun M.D.   On: 05/26/2024 18:44   CT CHEST ABDOMEN PELVIS W CONTRAST Result Date: 05/26/2024 CLINICAL DATA:  [place]lem[place] out of tree house EXAM: CT CHEST, ABDOMEN, AND PELVIS WITH CONTRAST TECHNIQUE: Multidetector CT imaging of the chest, abdomen and pelvis was performed following the standard protocol during bolus administration of intravenous contrast. RADIATION DOSE REDUCTION: This exam was performed according to the departmental dose-optimization program which includes automated exposure control, adjustment of the mA and/or kV according to patient size and/or use of iterative reconstruction technique. CONTRAST:  65mL OMNIPAQUE IOHEXOL 350 MG/ML SOLN COMPARISON:  Radiograph 05/26/2024 FINDINGS: CT CHEST FINDINGS Cardiovascular: Mild aortic atherosclerosis. No aneurysm. Normal aortic contour. Multi-vessel coronary vascular calcification. Normal cardiac size. No pericardial effusion Mediastinum/Nodes: Patent trachea. Left thyroid  nodule measuring 17 mm. No suspicious lymph nodes. Esophagus  within normal limits Lungs/Pleura: Negative for pleural effusion or pneumothorax. Hazy areas of probable dependent atelectasis. No focal airspace disease Musculoskeletal: Sternum appears intact. See separately dictated thoracic CT CT ABDOMEN PELVIS FINDINGS Hepatobiliary: No focal liver abnormality is seen. No gallstones, gallbladder wall thickening, or biliary dilatation. Pancreas: Unremarkable. No pancreatic ductal dilatation or surrounding inflammatory changes. Spleen: Normal in size without focal abnormality. Adrenals/Urinary Tract: Adrenal glands are unremarkable. Kidneys are normal, without renal calculi, focal lesion, or hydronephrosis. Bladder is unremarkable. Stomach/Bowel: Stomach is within normal limits. No evidence of bowel wall thickening, distention, or inflammatory changes. Vascular/Lymphatic: Aortic atherosclerosis. No enlarged abdominal or pelvic lymph nodes. Reproductive: Prostate is slightly enlarged Other: Negative for pelvic effusion or free air. Small fat containing left inguinal hernia Musculoskeletal: Old right inferior pubic ramus fracture. Remote right acetabular fracture. See separately dictated lumbar CT IMPRESSION: 1. No CT evidence for acute intrathoracic, intra-abdominal, or intrapelvic abnormality. See separately dictated spine CT for additional findings 2. 17 mm left thyroid  nodule. Recommend thyroid  US  (ref: J Am Coll Radiol. 2015 Feb;12(2): 143-50).This should be performed non emergently 3. Aortic atherosclerosis. Aortic Atherosclerosis (ICD10-I70.0). Electronically Signed   By: Luke Bun M.D.   On: 05/26/2024 18:31   CT HEAD WO CONTRAST Result Date: 05/26/2024 CLINICAL DATA:  Fall 8 feet from tree house with head/neck trauma. EXAM: CT HEAD WITHOUT CONTRAST CT CERVICAL SPINE WITHOUT CONTRAST TECHNIQUE: Multidetector CT imaging of the head and cervical spine was performed following  the standard protocol without intravenous contrast. Multiplanar CT image reconstructions of the  cervical spine were also generated. RADIATION DOSE REDUCTION: This exam was performed according to the departmental dose-optimization program which includes automated exposure control, adjustment of the mA and/or kV according to patient size and/or use of iterative reconstruction technique. COMPARISON:  Head CT 10/09/2019 FINDINGS: CT HEAD FINDINGS Brain: Ventricles, cisterns and other CSF spaces are normal. No mass, mass effect, shift of midline structures or acute hemorrhage. Remainder the exam is unchanged. Vascular: No hyperdense vessel or unexpected calcification. Skull: No acute fracture. Sinuses/Orbits: Orbits are normal. Air-fluid level over the left maxillary sinus. Minimal opacification over the ethmoid air cells. Other: None. CT CERVICAL SPINE FINDINGS Alignment: Normal. Skull base and vertebrae: Moderate spondylosis of the cervical spine to include uncovertebral joint spurring and facet arthropathy. Vertebral body heights are maintained. No acute fracture. Minimal bilateral neural foraminal narrowing at the C5-6 level and C6-7 levels. Soft tissues and spinal canal: No prevertebral fluid or swelling. No visible canal hematoma. Disc levels: Disc space narrowing at the C5-6 and C6-7 levels as well as the C7-T1 level. Upper chest: No acute findings. Other: None. IMPRESSION: 1. No acute brain injury. 2. No acute cervical spine injury. 3. 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 Minimal bilateral neural foraminal narrowing at the C5-6 and C6-7 levels. Electronically Signed   By: Toribio Agreste M.D.   On: 05/26/2024 18:31   CT CERVICAL SPINE WO CONTRAST Result Date: 05/26/2024 CLINICAL DATA:  Fall 8 feet from tree house with head/neck trauma. EXAM: CT HEAD WITHOUT CONTRAST CT CERVICAL SPINE WITHOUT CONTRAST TECHNIQUE: Multidetector CT imaging of the head and cervical spine was performed following the standard protocol without intravenous contrast.  Multiplanar CT image reconstructions of the cervical spine were also generated. RADIATION DOSE REDUCTION: This exam was performed according to the departmental dose-optimization program which includes automated exposure control, adjustment of the mA and/or kV according to patient size and/or use of iterative reconstruction technique. COMPARISON:  Head CT 10/09/2019 FINDINGS: CT HEAD FINDINGS Brain: Ventricles, cisterns and other CSF spaces are normal. No mass, mass effect, shift of midline structures or acute hemorrhage. Remainder the exam is unchanged. Vascular: No hyperdense vessel or unexpected calcification. Skull: No acute fracture. Sinuses/Orbits: Orbits are normal. Air-fluid level over the left maxillary sinus. Minimal opacification over the ethmoid air cells. Other: None. CT CERVICAL SPINE FINDINGS Alignment: Normal. Skull base and vertebrae: Moderate spondylosis of the cervical spine to include uncovertebral joint spurring and facet arthropathy. Vertebral body heights are maintained. No acute fracture. Minimal bilateral neural foraminal narrowing at the C5-6 level and C6-7 levels. Soft tissues and spinal canal: No prevertebral fluid or swelling. No visible canal hematoma. Disc levels: Disc space narrowing at the C5-6 and C6-7 levels as well as the C7-T1 level. Upper chest: No acute findings. Other: None. IMPRESSION: 1. No acute brain injury. 2. No acute cervical spine injury. 3. 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 Minimal bilateral neural foraminal narrowing at the C5-6 and C6-7 levels. Electronically Signed   By: Toribio Agreste M.D.   On: 05/26/2024 18:31   DG Pelvis Portable Result Date: 05/26/2024 CLINICAL DATA:  Fall. EXAM: PORTABLE PELVIS 1-2 VIEWS COMPARISON:  None Available. FINDINGS: There is no evidence of pelvic fracture or diastasis. No pelvic bone lesions are seen. IMPRESSION: Negative. Electronically Signed   By: Lynwood Landy Raddle  M.D.   On: 05/26/2024 17:33   DG Chest Port 1 View Result Date: 05/26/2024 CLINICAL DATA:  Fall. EXAM: PORTABLE CHEST 1 VIEW COMPARISON:  October 28, 2023. FINDINGS: Stable cardiomediastinal silhouette. Both lungs are clear. The visualized skeletal structures are unremarkable. IMPRESSION: No active disease. Electronically Signed   By: Lynwood Landy Raddle M.D.   On: 05/26/2024 17:32    Microbiology: Results for orders placed or performed during the hospital encounter of 07/10/19  Novel Coronavirus, NAA (Hosp order, Send-out to Ref Lab; TAT 18-24 hrs     Status: None   Collection Time: 07/10/19 12:27 PM   Specimen: Nasopharyngeal Swab; Respiratory  Result Value Ref Range Status   SARS-CoV-2, NAA NOT DETECTED NOT DETECTED Final    Comment: (NOTE) This nucleic acid amplification test was developed and its performance characteristics determined by [employer]. Nucleic acid amplification tests include PCR and TMA. This test has not been FDA cleared or approved. This test has been authorized by FDA under an Emergency Use Authorization (EUA). This test is only authorized for the duration of time the declaration that circumstances exist justifying the authorization of the emergency use of in vitro diagnostic tests for detection of SARS-CoV-2 virus and/or diagnosis of COVID-19 infection under section 564(b)(1) of the Act, 21 U.S.C. 639aaa-6(a) (1), unless the authorization is terminated or revoked sooner. When diagnostic testing is negative, the possibility of a false negative result should be considered in the context of a patient's recent exposures and the presence of clinical signs and symptoms consistent with COVID-19. An individual without symptoms of COVID- 19 and who is not shedding SARS-CoV-2 vi rus would expect to have a negative (not detected) result in this assay. Performed At: [facility] [address] [place], [place] [id number] Jennette Shorter MD Ey:[id number]     Coronavirus Source NASOPHARYNGEAL  Final    Comment: Performed at [facility], [address] [place], [place] [address]    Labs: CBC: Recent Labs  Lab 05/26/24 1712 05/26/24 1721 05/27/24 0439  WBC 7.4  --  8.1  HGB 15.6 15.0 15.2  HCT 44.2 44.0 42.5  MCV 92.5  --  92.2  PLT 188  --  152   Basic Metabolic Panel: Recent Labs  Lab 05/26/24 1712 05/26/24 1721 05/27/24 0439 05/30/24 0714  NA 136 139 136 136  K 4.1 4.0 3.8 3.7  CL 101 101 101 98  CO2 25  --  25 29  GLUCOSE 108* 108* 121* 110*  BUN 23 24* 20 16  CREATININE 1.40* 1.40* 1.06 1.07  CALCIUM 9.1  --  8.7* 9.1   Liver Function Tests: Recent Labs  Lab 05/26/24 1712  AST 25  ALT 15  ALKPHOS 47  BILITOT 0.9  PROT 6.8  ALBUMIN 4.0   CBG: No results for input(s): GLUCAP in the last 168 hours.  Discharge time spent: 43 minutes.  Signed: Deliliah Room, MD [facility] 06/01/2024

## 2024-06-02 ENCOUNTER — Other Ambulatory Visit (HOSPITAL_COMMUNITY): Payer: Self-pay

## 2024-06-02 DIAGNOSIS — I10 Essential (primary) hypertension: Secondary | ICD-10-CM | POA: Diagnosis not present

## 2024-06-02 DIAGNOSIS — E785 Hyperlipidemia, unspecified: Secondary | ICD-10-CM | POA: Diagnosis not present

## 2024-06-02 DIAGNOSIS — E041 Nontoxic single thyroid nodule: Secondary | ICD-10-CM | POA: Diagnosis not present

## 2024-06-02 DIAGNOSIS — T402X5D Adverse effect of other opioids, subsequent encounter: Secondary | ICD-10-CM | POA: Diagnosis not present

## 2024-06-02 DIAGNOSIS — K5903 Drug induced constipation: Secondary | ICD-10-CM | POA: Diagnosis not present

## 2024-06-02 DIAGNOSIS — F32A Depression, unspecified: Secondary | ICD-10-CM | POA: Diagnosis not present

## 2024-06-02 DIAGNOSIS — W19XXXD Unspecified fall, subsequent encounter: Secondary | ICD-10-CM | POA: Diagnosis not present

## 2024-06-02 DIAGNOSIS — M47814 Spondylosis without myelopathy or radiculopathy, thoracic region: Secondary | ICD-10-CM | POA: Diagnosis not present

## 2024-06-02 DIAGNOSIS — M48061 Spinal stenosis, lumbar region without neurogenic claudication: Secondary | ICD-10-CM | POA: Diagnosis not present

## 2024-06-02 DIAGNOSIS — C61 Malignant neoplasm of prostate: Secondary | ICD-10-CM | POA: Diagnosis not present

## 2024-06-02 DIAGNOSIS — M47816 Spondylosis without myelopathy or radiculopathy, lumbar region: Secondary | ICD-10-CM | POA: Diagnosis not present

## 2024-06-05 DIAGNOSIS — Z6827 Body mass index (BMI) 27.0-27.9, adult: Secondary | ICD-10-CM | POA: Diagnosis not present

## 2024-06-05 DIAGNOSIS — S32010A Wedge compression fracture of first lumbar vertebra, initial encounter for closed fracture: Secondary | ICD-10-CM | POA: Diagnosis not present

## 2024-06-09 DIAGNOSIS — C61 Malignant neoplasm of prostate: Secondary | ICD-10-CM | POA: Diagnosis not present

## 2024-06-12 ENCOUNTER — Other Ambulatory Visit (HOSPITAL_COMMUNITY): Payer: Self-pay | Admitting: Family Medicine

## 2024-06-12 DIAGNOSIS — Z23 Encounter for immunization: Secondary | ICD-10-CM | POA: Diagnosis not present

## 2024-06-12 DIAGNOSIS — S32010D Wedge compression fracture of first lumbar vertebra, subsequent encounter for fracture with routine healing: Secondary | ICD-10-CM | POA: Diagnosis not present

## 2024-06-12 DIAGNOSIS — C61 Malignant neoplasm of prostate: Secondary | ICD-10-CM | POA: Diagnosis not present

## 2024-06-12 DIAGNOSIS — E041 Nontoxic single thyroid nodule: Secondary | ICD-10-CM | POA: Diagnosis not present

## 2024-06-15 DIAGNOSIS — E041 Nontoxic single thyroid nodule: Secondary | ICD-10-CM | POA: Diagnosis not present

## 2024-06-15 DIAGNOSIS — E785 Hyperlipidemia, unspecified: Secondary | ICD-10-CM | POA: Diagnosis not present

## 2024-06-15 DIAGNOSIS — K5903 Drug induced constipation: Secondary | ICD-10-CM | POA: Diagnosis not present

## 2024-06-15 DIAGNOSIS — M47814 Spondylosis without myelopathy or radiculopathy, thoracic region: Secondary | ICD-10-CM | POA: Diagnosis not present

## 2024-06-15 DIAGNOSIS — T402X5D Adverse effect of other opioids, subsequent encounter: Secondary | ICD-10-CM | POA: Diagnosis not present

## 2024-06-15 DIAGNOSIS — M47816 Spondylosis without myelopathy or radiculopathy, lumbar region: Secondary | ICD-10-CM | POA: Diagnosis not present

## 2024-06-15 DIAGNOSIS — W19XXXD Unspecified fall, subsequent encounter: Secondary | ICD-10-CM | POA: Diagnosis not present

## 2024-06-15 DIAGNOSIS — I10 Essential (primary) hypertension: Secondary | ICD-10-CM | POA: Diagnosis not present

## 2024-06-15 DIAGNOSIS — S32018D Other fracture of first lumbar vertebra, subsequent encounter for fracture with routine healing: Secondary | ICD-10-CM | POA: Diagnosis not present

## 2024-06-15 DIAGNOSIS — M48061 Spinal stenosis, lumbar region without neurogenic claudication: Secondary | ICD-10-CM | POA: Diagnosis not present

## 2024-06-15 DIAGNOSIS — C61 Malignant neoplasm of prostate: Secondary | ICD-10-CM | POA: Diagnosis not present

## 2024-06-15 DIAGNOSIS — F32A Depression, unspecified: Secondary | ICD-10-CM | POA: Diagnosis not present

## 2024-06-17 ENCOUNTER — Ambulatory Visit (HOSPITAL_COMMUNITY)
Admission: RE | Admit: 2024-06-17 | Discharge: 2024-06-17 | Disposition: A | Discharge status: Home / Self Care | Source: Ambulatory Visit | Attending: Family Medicine | Admitting: Family Medicine

## 2024-06-17 DIAGNOSIS — K5903 Drug induced constipation: Secondary | ICD-10-CM | POA: Diagnosis not present

## 2024-06-17 DIAGNOSIS — M48061 Spinal stenosis, lumbar region without neurogenic claudication: Secondary | ICD-10-CM | POA: Diagnosis not present

## 2024-06-17 DIAGNOSIS — W19XXXD Unspecified fall, subsequent encounter: Secondary | ICD-10-CM | POA: Diagnosis not present

## 2024-06-17 DIAGNOSIS — T402X5D Adverse effect of other opioids, subsequent encounter: Secondary | ICD-10-CM | POA: Diagnosis not present

## 2024-06-17 DIAGNOSIS — I10 Essential (primary) hypertension: Secondary | ICD-10-CM | POA: Diagnosis not present

## 2024-06-17 DIAGNOSIS — E041 Nontoxic single thyroid nodule: Secondary | ICD-10-CM | POA: Insufficient documentation

## 2024-06-17 DIAGNOSIS — M47816 Spondylosis without myelopathy or radiculopathy, lumbar region: Secondary | ICD-10-CM | POA: Diagnosis not present

## 2024-06-17 DIAGNOSIS — M47814 Spondylosis without myelopathy or radiculopathy, thoracic region: Secondary | ICD-10-CM | POA: Diagnosis not present

## 2024-06-17 DIAGNOSIS — E785 Hyperlipidemia, unspecified: Secondary | ICD-10-CM | POA: Diagnosis not present

## 2024-06-17 DIAGNOSIS — C61 Malignant neoplasm of prostate: Secondary | ICD-10-CM | POA: Diagnosis not present

## 2024-06-17 DIAGNOSIS — F32A Depression, unspecified: Secondary | ICD-10-CM | POA: Diagnosis not present

## 2024-06-17 DIAGNOSIS — S32018D Other fracture of first lumbar vertebra, subsequent encounter for fracture with routine healing: Secondary | ICD-10-CM | POA: Diagnosis not present

## 2024-06-25 NOTE — Patient Instructions (Addendum)
 SURGICAL WAITING ROOM VISITATION  Patients having surgery or a procedure may have no more than 2 support people in the waiting area - these visitors may rotate.    Children under the age of 57 must have an adult with them who is not the patient.  Visitors with respiratory illnesses are discouraged from visiting and should remain at home.  If the patient needs to stay at the hospital during part of their recovery, the visitor guidelines for inpatient rooms apply. Pre-op nurse will coordinate an appropriate time for 1 support person to accompany patient in pre-op.  This support person may not rotate.    Please refer to the [facility] website for the visitor guidelines for Inpatients (after your surgery is over and you are in a regular room).    Your procedure is scheduled on: 07/02/24   Report to [facility] Main Entrance    Report to admitting at 9:00 AM   Call this number if you have problems the morning of surgery [phone]   Follow a clear liquid diet the day before surgery.  Water Non-Citrus Juices (without pulp, NO RED-Apple, White grape, White cranberry) Black Coffee (NO MILK/CREAM OR CREAMERS, sugar ok)  Clear Tea (NO MILK/CREAM OR CREAMERS, sugar ok) regular and decaf                             Plain Jell-O (NO RED)                                           Fruit ices (not with fruit pulp, NO RED)                                     Popsicles (NO RED)                                                               Sports drinks like Gatorade (NO RED)              Nothing to drink after midnight.          If you have questions, please contact your surgeon's office.   FOLLOW BOWEL PREP AND ANY ADDITIONAL PRE OP INSTRUCTIONS YOU RECEIVED FROM YOUR SURGEON'S OFFICE!!! Drink magnesium citrate at noon day before. US  fleet enema night before     Oral Hygiene is also important to reduce your risk of infection.                                    Remember - BRUSH YOUR  TEETH THE MORNING OF SURGERY WITH YOUR REGULAR TOOTHPASTE  DENTURES WILL BE REMOVED PRIOR TO SURGERY PLEASE DO NOT APPLY Poly grip OR ADHESIVES!!!   Stop all vitamins and herbal supplements 7 days before surgery.   Take these medicines the morning of surgery with A SIP OF WATER: Tylenol , Bupropion, Dilaudid  You may not have any metal on your body including jewelry, and body piercing             Do not wear lotions, powders, cologne, or deodorant              Men may shave face and neck.   Do not bring valuables to the hospital. [place] IS NOT             RESPONSIBLE   FOR VALUABLES.   Contacts, glasses, dentures or bridgework may not be worn into surgery.   Bring small overnight bag day of surgery.   DO NOT BRING YOUR HOME MEDICATIONS TO THE HOSPITAL. PHARMACY WILL DISPENSE MEDICATIONS LISTED ON YOUR MEDICATION LIST TO YOU DURING YOUR ADMISSION IN THE HOSPITAL!   Special Instructions: Bring a copy of your healthcare power of attorney and living will documents the day of surgery if you haven't scanned them before.              Please read over the following fact sheets you were given: IF YOU HAVE QUESTIONS ABOUT YOUR PRE-OP INSTRUCTIONS PLEASE CALL [phone] [place].   If you received a COVID test during your pre-op visit  it is requested that you wear a mask when out in public, stay away from anyone that may not be feeling well and notify your surgeon if you develop symptoms. If you test positive for Covid or have been in contact with anyone that has tested positive in the last 10 days please notify you surgeon.    [place] - Preparing for Surgery Before surgery, you can play an important role.  Because skin is not sterile, your skin needs to be as free of germs as possible.  You can reduce the number of germs on your skin by washing with CHG (chlorahexidine gluconate) soap before surgery.  CHG is an antiseptic cleaner which kills germs and  bonds with the skin to continue killing germs even after washing. Please DO NOT use if you have an allergy to CHG or antibacterial soaps.  If your skin becomes reddened/irritated stop using the CHG and inform your nurse when you arrive at Short Stay. Do not shave (including legs and underarms) for at least 48 hours prior to the first CHG shower.  You may shave your face/neck.  Please follow these instructions carefully:  1.  Shower with CHG Soap the night before surgery ONLY (DO NOT USE THE SOAP THE MORNING OF SURGERY).  2.  If you choose to wash your hair, wash your hair first as usual with your normal  shampoo.  3.  After you shampoo, rinse your hair and body thoroughly to remove the shampoo.                             4.  Use CHG as you would any other liquid soap.  You can apply chg directly to the skin and wash.  Gently with a scrungie or clean washcloth.  5.  Apply the CHG Soap to your body ONLY FROM THE NECK DOWN.   Do   not use on face/ open                           Wound or open sores. Avoid contact with eyes, ears mouth and   genitals (private parts).  Wash face,  Genitals (private parts) with your normal soap.             6.  Wash thoroughly, paying special attention to the area where your    surgery  will be performed.  7.  Thoroughly rinse your body with warm water from the neck down.  8.  DO NOT shower/wash with your normal soap after using and rinsing off the CHG Soap.                9.  Pat yourself dry with a clean towel.            10.  Wear clean pajamas.            11.  Place clean sheets on your bed the night of your first shower and do not  sleep with pets. Day of Surgery : Do not apply any CHG, lotions/deodorants the morning of surgery.  Please wear clean clothes to the hospital/surgery center.  FAILURE TO FOLLOW THESE INSTRUCTIONS MAY RESULT IN THE CANCELLATION OF YOUR SURGERY  PATIENT SIGNATURE_________________________________  NURSE  SIGNATURE__________________________________  ________________________________________________________________________ WHAT IS A BLOOD TRANSFUSION? Blood Transfusion Information  A transfusion is the replacement of blood or some of its parts. Blood is made up of multiple cells which provide different functions. Red blood cells carry oxygen and are used for blood loss replacement. White blood cells fight against infection. Platelets control bleeding. Plasma helps clot blood. Other blood products are available for specialized needs, such as hemophilia or other clotting disorders. BEFORE THE TRANSFUSION  Who gives blood for transfusions?  Healthy volunteers who are fully evaluated to make sure their blood is safe. This is blood bank blood. Transfusion therapy is the safest it has ever been in the practice of medicine. Before blood is taken from a donor, a complete history is taken to make sure that person has no history of diseases nor engages in risky social behavior (examples are intravenous drug use or sexual activity with multiple partners). The donor's travel history is screened to minimize risk of transmitting infections, such as malaria. The donated blood is tested for signs of infectious diseases, such as HIV and hepatitis. The blood is then tested to be sure it is compatible with you in order to minimize the chance of a transfusion reaction. If you or a relative donates blood, this is often done in anticipation of surgery and is not appropriate for emergency situations. It takes many days to process the donated blood. RISKS AND COMPLICATIONS Although transfusion therapy is very safe and saves many lives, the main dangers of transfusion include:  Getting an infectious disease. Developing a transfusion reaction. This is an allergic reaction to something in the blood you were given. Every precaution is taken to prevent this. The decision to have a blood transfusion has been considered carefully  by your caregiver before blood is given. Blood is not given unless the benefits outweigh the risks. AFTER THE TRANSFUSION Right after receiving a blood transfusion, you will usually feel much better and more energetic. This is especially true if your red blood cells have gotten low (anemic). The transfusion raises the level of the red blood cells which carry oxygen, and this usually causes an energy increase. The nurse administering the transfusion will monitor you carefully for complications. HOME CARE INSTRUCTIONS  No special instructions are needed after a transfusion. You may find your energy is better. Speak with your caregiver about any limitations on activity for underlying diseases you  may have. SEEK MEDICAL CARE IF:  Your condition is not improving after your transfusion. You develop redness or irritation at the intravenous (IV) site. SEEK IMMEDIATE MEDICAL CARE IF:  Any of the following symptoms occur over the next 12 hours: Shaking chills. You have a temperature by mouth above 102 F (38.9 C), not controlled by medicine. Chest, back, or muscle pain. People around you feel you are not acting correctly or are confused. Shortness of breath or difficulty breathing. Dizziness and fainting. You get a rash or develop hives. You have a decrease in urine output. Your urine turns a dark color or changes to pink, red, or brown. Any of the following symptoms occur over the next 10 days: You have a temperature by mouth above 102 F (38.9 C), not controlled by medicine. Shortness of breath. Weakness after normal activity. The white part of the eye turns yellow (jaundice). You have a decrease in the amount of urine or are urinating less often. Your urine turns a dark color or changes to pink, red, or brown. Document Released: 08/10/2000 Document Revised: 11/05/2011 Document Reviewed: 03/29/2008 [facility] Patient Information 2014 [place],  [place].  _______________________________________________________________________

## 2024-06-25 NOTE — Progress Notes (Addendum)
 COVID Vaccine Completed:  Date of COVID positive in last 90 days:  PCP - Alm Rav, MD Cardiologist - n/a  Chest x-ray - 05/26/24 Epic EKG - 06/29/24 Epic/chart Stress Test - N/A ECHO - N/A Cardiac Cath - n/a Pacemaker/ICD device last checked:N/A Spinal Cord Stimulator:N/A  Bowel Prep - clears day before, fleet enema, magnesium citrate. Patient aware and has supplies  Sleep Study - N/A CPAP -   Fasting Blood Sugar - N/A Checks Blood Sugar _____ times a day  Last dose of GLP1 agonist-  N/A GLP1 instructions:  Do not take after     Last dose of SGLT-2 inhibitors-  N/A SGLT-2 instructions:  Do not take after     Blood Thinner Instructions: N/A Last dose:   Time: Aspirin Instructions:N/A Last Dose:  Activity level: Can go up a flight of stairs and perform activities of daily living without stopping and without symptoms of chest pain or shortness of breath.   Anesthesia review: N/A  Patient denies shortness of breath, fever, cough and chest pain at PAT appointment  Patient verbalized understanding of instructions that were given to them at the PAT appointment. Patient was also instructed that they will need to review over the PAT instructions again at home before surgery.

## 2024-06-29 ENCOUNTER — Other Ambulatory Visit: Payer: Self-pay

## 2024-06-29 ENCOUNTER — Encounter (HOSPITAL_COMMUNITY): Payer: Self-pay

## 2024-06-29 ENCOUNTER — Encounter (HOSPITAL_COMMUNITY)
Admission: RE | Admit: 2024-06-29 | Discharge: 2024-06-29 | Disposition: A | Source: Ambulatory Visit | Attending: Urology

## 2024-06-29 VITALS — BP 142/97 | HR 58 | Temp 97.8°F | Resp 14 | Ht 69.0 in | Wt 183.0 lb

## 2024-06-29 DIAGNOSIS — R001 Bradycardia, unspecified: Secondary | ICD-10-CM | POA: Insufficient documentation

## 2024-06-29 DIAGNOSIS — Z01818 Encounter for other preprocedural examination: Secondary | ICD-10-CM | POA: Diagnosis not present

## 2024-06-29 DIAGNOSIS — I10 Essential (primary) hypertension: Secondary | ICD-10-CM | POA: Insufficient documentation

## 2024-06-29 LAB — BASIC METABOLIC PANEL WITH GFR
Anion gap: 9 (ref 5–15)
BUN: 19 mg/dL (ref 8–23)
CO2: 26 mmol/L (ref 22–32)
Calcium: 9.4 mg/dL (ref 8.9–10.3)
Chloride: 104 mmol/L (ref 98–111)
Creatinine, Ser: 0.95 mg/dL (ref 0.61–1.24)
GFR, Estimated: >60 mL/min (ref >60)
Glucose, Bld: 93 mg/dL (ref 70–99)
Potassium: 4.1 mmol/L (ref 3.5–5.1)
Sodium: 140 mmol/L (ref 135–145)

## 2024-06-29 LAB — CBC
HCT: 44.2 % (ref 39.0–52.0)
Hemoglobin: 15.3 g/dL (ref 13.0–17.0)
MCH: 32.8 pg (ref 26.0–34.0)
MCHC: 34.6 g/dL (ref 30.0–36.0)
MCV: 94.8 fL (ref 80.0–100.0)
Platelets: 149 K/uL — ABNORMAL LOW (ref 150–400)
RBC: 4.66 MIL/uL (ref 4.22–5.81)
RDW: 12.1 % (ref 11.5–15.5)
WBC: 4.1 K/uL (ref 4.0–10.5)
nRBC: 0 % (ref 0.0–0.2)

## 2024-07-01 NOTE — H&P (Signed)
 Visit Note - June 09, 2024 Beulah, Matusek MRN: [id number] Phone: [phone] DOB: 1951-03-22 Sex: Male PMS ID: [id number] GRETEL FERRARA (Primary Provider) (Bill Under) Page 1 [phone] Work [phone] Fax [facility] [address] [place] [address] [place], [place] [address] Social History Smoking status - Unspecified Tobacco Use Does not use vaping products Does not use smokeless tobacco Medications None reported. Allergies No known drug allergies Alerts No currently taking blood thinners and no allergy to latex. ROS Provider reviewed on Jun 09, 2024. A focused review of systems was performed including Cardiovascular, Constitutional / Symptom, Gastrointestinal (G.I.), Genitourinary (G.U.), Musculoskeletal, Psychiatric, and Respiratory and was notable for Depression. No Blood In Urine, No Pain Or Burning With Urination, No Urinary Incontinence, No Testicular Pain/testicular Swelling, No Back Pain, No Chest Pain, No Swelling In Legs, No Fever Or Chills, No Unintentional Weight Loss, No Abdominal Pain, No Nausea/vomiting, And No Shortness Of Breath. Medical History Depressive disorder Elevated blood pressure Malignant tumor of prostate Surgical History Biopsy of prostate CC/HPI: CC: Prostate Cancer Physician requesting consult: Dr. Norleen Seltzer PCP: Dr. Alm Rav Mr. Mikita is a 73 year old gentleman who was noted to have an elevated PSA of 5.12. This prompted further evaluation with an MRI of the prostate on 02/17/2024 that demonstrated a 1.3 cm PI-RADS 4 lesion of the left mid peripheral zone of the prostate. He underwent an MR/ultrasound fusion biopsy on 03/31/2024. 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 A total of 8 out of 12 systematic biopsy cores were positive in 2 out of the 3 left-sided targeted lesions were positive for malignancy for a total of 10 out of 15 cores. Family history: He has a father and brother with a history of  prostate cancer. Imaging studies: MRI (02/17/2024): No EPE, SVI, LAD, or bone lesions. PSMA PET scan (04/09/2024): Uptake within the left side of the prostate consistent with his MRI findings. No metastatic disease. PMH: He has a history of hypertension and depression. PSH: No abdominal surgeries. TNM stage: cT1c N0 M0 PSA: 5.12 Gleason score: 4+4=8 (GG 4) Biopsy (03/31/24): 10/15 cores positive Left: Left lateral apex (5%, 3+3 = 6), left apex (30%, 4+3 = 7), left mid (50%, 4+3 = 7), left lateral base (10%, 3+4 = 7), left base (50%, 4+4 = 8) Right: Right mid (20%, 3+3 = 6), right base (60%, 3+4 = 7), right lateral base (20%, 4+3 = 7) ROI: 2 out of 3 cores positive (60% of 1 core with Gleason 4+4 = 8, 60% of 1 core with Gleason 4+3 = 7) Prostate volume: 32 cc Nomogram OC disease: 31% EPE: 68% SVI: 21% LNI: 20% PFS (5 year, 10 year): 46%, 31% Urinary function: IPSS is 7. Erectile function: SHIM score is 15. Vitals: Date Taken By B.P. Pulse Resp. O2 Sat. Temp. Ht. Wt. BMI BSA 06/09/24 08:59 MCDOUGALD, SONIA 119/74 SIT 81 68.0 in* 184.0 lbs* 28 2 FiO2 * Patient Reported Exam: General: Alert and oriented, well developed CV: RRR Lungs: Clear bilaterally Abdomen: Soft, non distended, no masses Prostate: 40 cc. No modularity or induration. Impression/Plan: I had a detailed discussion with the patient today regarding his prostate cancer diagnosis and options for treatment/management. We discussed these options in the context of the patient's disease parameters as well as his age (73) and life expectancy. The patient was counseled about the natural history of prostate cancer and the standard Visit Note - June 09, 2024 Mylz, Yuan MRN: [id number] Phone: [phone] DOB: [birth year] Sex: Male PMS ID: [id number]  [facility] Scientist, Physiological Provider) (Bill Under) Page 2 [phone] Work [phone] Fax [facility] [address] [place] [address] [place], [place]  [address] treatment options that are available for prostate cancer. It was explained to him how his age and life expectancy, clinical stage, Gleason score/prognostic grade group, and PSA (and PSA density) affect his prognosis, the decision to proceed with additional staging studies, as well as how that information influences recommended treatment strategies. We discussed the roles for active surveillance, radiation therapy, surgical therapy, androgen deprivation, as well as ablative therapy and other investigational options for the treatment of prostate cancer as appropriate to his individual cancer situation. We discussed the risks and benefits of these options with regard to their impact on cancer control and also in terms of potential adverse events, complications, and impact on quality of life particularly related to urinary and sexual function. The patient was encouraged to ask questions throughout the discussion today and all questions were answered to his stated satisfaction. In addition, the patient was provided with and/or directed to appropriate resources and literature for further education about prostate cancer and treatment options. We discussed surgical therapy for prostate cancer including the different available surgical approaches. We discussed, in detail, the risks and expectations of surgery with regard to cancer control, urinary control, and erectile function as well as the expected postoperative recovery process. Additional risks of surgery including but not limited to bleeding, infection, hernia formation, nerve damage, lymphocele formation, bowel/rectal injury potentially necessitating colostomy, damage to the urinary tract resulting in urine leakage, urethral stricture, and the cardiopulmonary risks such as myocardial infarction, stroke, death, venothromboembolism, etc. were explained. The risk of open surgical conversion for robotic/laparoscopic prostatectomy was also  discussed. He does adamantly wish to proceed with surgical therapy and has been scheduled for a bilateral nerve-sparing robot-assisted laparoscopic radical prostatectomy and bilateral pelvic lymphadenectomy. I will confirm with his neurosurgeon that he is good to proceed considering his recent lumbar vertebral fracture. Prostate Cancer (C61) Plan: Counseling for Prostate Cancer. Please refer to the education handout for detailed counseling. Plan: Order Radiographic Studies. Diagnosis: Prostate Cancer - C61 Indication: Prostate Cancer Provider: GRETEL FERRARA Priority: normal 1. Follow up for: Further Evaluation and Management. Other Instructions: Proceed with surgery as scheduled. Total time of 52 minutes personally spent by the physician and/or other qualified health care professional assessing and managing the patient on the date of the encounter doing the following: preparing to see the patient (eg: review of tests), performing a medically appropriate examination and/or evaluation, and counseling and educating the patient/family/caregiver. Staff: GRETEL FERRARA (Primary Provider) Cherylin Under) Patient Referrals: Seabron Lenis MD - Primary Care Provider (PCP) Electronically Signed By: GRETEL FERRARA, 06/09/2024 05:05 PM EDT

## 2024-07-02 ENCOUNTER — Other Ambulatory Visit: Payer: Self-pay

## 2024-07-02 ENCOUNTER — Encounter (HOSPITAL_COMMUNITY): Payer: Self-pay | Admitting: Physician Assistant

## 2024-07-02 ENCOUNTER — Encounter (HOSPITAL_COMMUNITY)
Admission: RE | Disposition: A | Discharge status: Home / Self Care | Payer: Self-pay | Source: Home / Self Care | Attending: Urology

## 2024-07-02 ENCOUNTER — Encounter (HOSPITAL_COMMUNITY): Payer: Self-pay | Admitting: Urology

## 2024-07-02 ENCOUNTER — Ambulatory Visit (HOSPITAL_COMMUNITY): Admitting: Anesthesiology

## 2024-07-02 ENCOUNTER — Observation Stay (HOSPITAL_COMMUNITY)
Admission: RE | Admit: 2024-07-02 | Discharge: 2024-07-03 | Disposition: A | Discharge status: Home / Self Care | Attending: Urology | Admitting: Urology

## 2024-07-02 DIAGNOSIS — C61 Malignant neoplasm of prostate: Principal | ICD-10-CM | POA: Insufficient documentation

## 2024-07-02 DIAGNOSIS — I10 Essential (primary) hypertension: Secondary | ICD-10-CM | POA: Insufficient documentation

## 2024-07-02 HISTORY — PX: ROBOT ASSISTED LAPAROSCOPIC RADICAL PROSTATECTOMY: SHX5141

## 2024-07-02 LAB — HEMOGLOBIN AND HEMATOCRIT, BLOOD
HCT: 46 % (ref 39.0–52.0)
Hemoglobin: 15.4 g/dL (ref 13.0–17.0)

## 2024-07-02 LAB — TYPE AND SCREEN
ABO/RH(D): O POS
Antibody Screen: NEGATIVE

## 2024-07-02 LAB — ABO/RH: ABO/RH(D): O POS

## 2024-07-02 SURGERY — PROSTATECTOMY, RADICAL, ROBOT-ASSISTED, LAPAROSCOPIC
Anesthesia: General

## 2024-07-02 MED ORDER — LACTATED RINGERS IV SOLN
INTRAVENOUS | Status: DC | PRN
  Administered 2024-07-02: 200 mL

## 2024-07-02 MED ORDER — DIPHENHYDRAMINE HCL 50 MG/ML IJ SOLN: 12.5000 mg | Freq: Four times a day (QID) | INTRAMUSCULAR | Status: DC | PRN

## 2024-07-02 MED ORDER — FENTANYL CITRATE (PF) 50 MCG/ML IJ SOSY
25.0000 ug | PREFILLED_SYRINGE | INTRAMUSCULAR | Status: DC | PRN
  Administered 2024-07-02 (×3): 50 ug via INTRAVENOUS

## 2024-07-02 MED ORDER — ONDANSETRON HCL 4 MG/2ML IJ SOLN
INTRAMUSCULAR | Status: AC
  Filled 2024-07-02: qty 2

## 2024-07-02 MED ORDER — LACTATED RINGERS IV SOLN: INTRAVENOUS | Status: DC

## 2024-07-02 MED ORDER — CEFAZOLIN SODIUM-DEXTROSE 2-4 GM/100ML-% IV SOLN
2.0000 g | INTRAVENOUS | Status: AC
  Administered 2024-07-02: 2 g via INTRAVENOUS
  Filled 2024-07-02: qty 100

## 2024-07-02 MED ORDER — SUGAMMADEX SODIUM 200 MG/2ML IV SOLN
INTRAVENOUS | Status: DC | PRN
  Administered 2024-07-02: 170 mg via INTRAVENOUS

## 2024-07-02 MED ORDER — KETAMINE HCL 50 MG/5ML IJ SOSY
PREFILLED_SYRINGE | INTRAMUSCULAR | Status: DC | PRN
  Administered 2024-07-02: 30 mg via INTRAVENOUS

## 2024-07-02 MED ORDER — ROCURONIUM BROMIDE 10 MG/ML (PF) SYRINGE
PREFILLED_SYRINGE | INTRAVENOUS | Status: DC | PRN
  Administered 2024-07-02: 20 mg via INTRAVENOUS
  Administered 2024-07-02: 70 mg via INTRAVENOUS

## 2024-07-02 MED ORDER — LIDOCAINE HCL (PF) 2 % IJ SOLN
INTRAMUSCULAR | Status: AC
  Filled 2024-07-02: qty 5

## 2024-07-02 MED ORDER — ACETAMINOPHEN 325 MG PO TABS
650.0000 mg | ORAL_TABLET | ORAL | Status: DC | PRN
  Administered 2024-07-03: 650 mg via ORAL
  Filled 2024-07-02: qty 2

## 2024-07-02 MED ORDER — ACETAMINOPHEN 10 MG/ML IV SOLN: 1000.0000 mg | Freq: Once | INTRAVENOUS | Status: DC | PRN

## 2024-07-02 MED ORDER — EPHEDRINE 5 MG/ML INJ
INTRAVENOUS | Status: AC
  Filled 2024-07-02: qty 5

## 2024-07-02 MED ORDER — BUPROPION HCL ER (XL) 300 MG PO TB24
300.0000 mg | ORAL_TABLET | Freq: Every morning | ORAL | Status: DC
  Administered 2024-07-03: 300 mg via ORAL
  Filled 2024-07-02: qty 1

## 2024-07-02 MED ORDER — FENTANYL CITRATE (PF) 50 MCG/ML IJ SOSY
PREFILLED_SYRINGE | INTRAMUSCULAR | Status: AC
  Filled 2024-07-02: qty 1

## 2024-07-02 MED ORDER — OXYCODONE HCL 5 MG PO TABS: 5.0000 mg | ORAL_TABLET | Freq: Once | ORAL | Status: DC | PRN

## 2024-07-02 MED ORDER — HEPARIN SODIUM (PORCINE) 1000 UNIT/ML IJ SOLN
INTRAMUSCULAR | Status: AC
  Filled 2024-07-02: qty 1

## 2024-07-02 MED ORDER — LIDOCAINE HCL (CARDIAC) PF 100 MG/5ML IV SOSY
PREFILLED_SYRINGE | INTRAVENOUS | Status: DC | PRN
  Administered 2024-07-02: 100 mg via INTRAVENOUS

## 2024-07-02 MED ORDER — CHLORHEXIDINE GLUCONATE 0.12 % MT SOLN
15.0000 mL | Freq: Once | OROMUCOSAL | Status: AC
  Administered 2024-07-02: 15 mL via OROMUCOSAL

## 2024-07-02 MED ORDER — KETAMINE HCL 50 MG/5ML IJ SOSY
PREFILLED_SYRINGE | INTRAMUSCULAR | Status: AC
  Filled 2024-07-02: qty 5

## 2024-07-02 MED ORDER — DOCUSATE SODIUM 100 MG PO CAPS
100.0000 mg | ORAL_CAPSULE | Freq: Two times a day (BID) | ORAL | Status: AC
Start: 2024-07-02 — End: ?

## 2024-07-02 MED ORDER — DOCUSATE SODIUM 100 MG PO CAPS
100.0000 mg | ORAL_CAPSULE | Freq: Two times a day (BID) | ORAL | Status: DC
  Administered 2024-07-02 – 2024-07-03 (×2): 100 mg via ORAL
  Filled 2024-07-02 (×2): qty 1

## 2024-07-02 MED ORDER — SULFAMETHOXAZOLE-TRIMETHOPRIM 800-160 MG PO TABS: 1.0000 | ORAL_TABLET | Freq: Two times a day (BID) | ORAL | 0 refills | Status: AC

## 2024-07-02 MED ORDER — FENTANYL CITRATE (PF) 250 MCG/5ML IJ SOLN
INTRAMUSCULAR | Status: AC
  Filled 2024-07-02: qty 5

## 2024-07-02 MED ORDER — ORAL CARE MOUTH RINSE
15.0000 mL | Freq: Once | OROMUCOSAL | Status: AC
Start: 2024-07-02 — End: 2024-07-02

## 2024-07-02 MED ORDER — STERILE WATER FOR IRRIGATION IR SOLN
Status: DC | PRN
  Administered 2024-07-02: 1000 mL via INTRAVESICAL

## 2024-07-02 MED ORDER — PROPOFOL 10 MG/ML IV BOLUS
INTRAVENOUS | Status: AC
  Filled 2024-07-02: qty 20

## 2024-07-02 MED ORDER — OXYCODONE HCL 5 MG/5ML PO SOLN: 5.0000 mg | Freq: Once | ORAL | Status: DC | PRN

## 2024-07-02 MED ORDER — TRIPLE ANTIBIOTIC 3.5-400-5000 EX OINT: 1.0000 | TOPICAL_OINTMENT | Freq: Three times a day (TID) | CUTANEOUS | Status: DC | PRN

## 2024-07-02 MED ORDER — HYOSCYAMINE SULFATE 0.125 MG SL SUBL: 0.1250 mg | SUBLINGUAL_TABLET | Freq: Four times a day (QID) | SUBLINGUAL | Status: DC | PRN

## 2024-07-02 MED ORDER — ONDANSETRON HCL 4 MG/2ML IJ SOLN
INTRAMUSCULAR | Status: DC | PRN
  Administered 2024-07-02: 4 mg via INTRAVENOUS

## 2024-07-02 MED ORDER — HYDROMORPHONE HCL 1 MG/ML IJ SOLN
0.5000 mg | INTRAMUSCULAR | Status: DC | PRN
  Administered 2024-07-02: 1 mg via INTRAVENOUS
  Filled 2024-07-02: qty 1

## 2024-07-02 MED ORDER — KCL IN DEXTROSE-NACL 20-5-0.45 MEQ/L-%-% IV SOLN
INTRAVENOUS | Status: DC
  Filled 2024-07-02 (×4): qty 1000

## 2024-07-02 MED ORDER — ONDANSETRON HCL 4 MG/2ML IJ SOLN: 4.0000 mg | INTRAMUSCULAR | Status: DC | PRN

## 2024-07-02 MED ORDER — PROPOFOL 10 MG/ML IV BOLUS
INTRAVENOUS | Status: DC | PRN
  Administered 2024-07-02: 140 mg via INTRAVENOUS

## 2024-07-02 MED ORDER — HYDROMORPHONE HCL 1 MG/ML IJ SOLN
0.2500 mg | INTRAMUSCULAR | Status: DC | PRN
  Administered 2024-07-02 (×2): 0.5 mg via INTRAVENOUS

## 2024-07-02 MED ORDER — KETOROLAC TROMETHAMINE 15 MG/ML IJ SOLN
15.0000 mg | Freq: Four times a day (QID) | INTRAMUSCULAR | Status: DC
  Administered 2024-07-02 – 2024-07-03 (×3): 15 mg via INTRAVENOUS
  Filled 2024-07-02 (×3): qty 1

## 2024-07-02 MED ORDER — EPHEDRINE SULFATE-NACL 50-0.9 MG/10ML-% IV SOSY
PREFILLED_SYRINGE | INTRAVENOUS | Status: DC | PRN
  Administered 2024-07-02: 5 mg via INTRAVENOUS

## 2024-07-02 MED ORDER — ZOLPIDEM TARTRATE 5 MG PO TABS
5.0000 mg | ORAL_TABLET | Freq: Every evening | ORAL | Status: DC | PRN
  Administered 2024-07-03: 5 mg via ORAL
  Filled 2024-07-02: qty 1

## 2024-07-02 MED ORDER — FLEET ENEMA RE ENEM: 1.0000 | ENEMA | Freq: Once | RECTAL | Status: DC

## 2024-07-02 MED ORDER — DIPHENHYDRAMINE HCL 12.5 MG/5ML PO ELIX: 12.5000 mg | ORAL_SOLUTION | Freq: Four times a day (QID) | ORAL | Status: DC | PRN

## 2024-07-02 MED ORDER — FENTANYL CITRATE (PF) 250 MCG/5ML IJ SOLN
INTRAMUSCULAR | Status: DC | PRN
  Administered 2024-07-02 (×3): 50 ug via INTRAVENOUS
  Administered 2024-07-02: 100 ug via INTRAVENOUS

## 2024-07-02 MED ORDER — MAGNESIUM CITRATE PO SOLN: 1.0000 | Freq: Once | ORAL | Status: DC

## 2024-07-02 MED ORDER — BUPIVACAINE-EPINEPHRINE (PF) 0.25% -1:200000 IJ SOLN
INTRAMUSCULAR | Status: AC
  Filled 2024-07-02: qty 30

## 2024-07-02 MED ORDER — SUGAMMADEX SODIUM 200 MG/2ML IV SOLN
INTRAVENOUS | Status: AC
  Filled 2024-07-02: qty 2

## 2024-07-02 MED ORDER — CEFAZOLIN SODIUM-DEXTROSE 1-4 GM/50ML-% IV SOLN
1.0000 g | Freq: Three times a day (TID) | INTRAVENOUS | Status: AC
  Administered 2024-07-02 – 2024-07-03 (×2): 1 g via INTRAVENOUS
  Filled 2024-07-02 (×2): qty 50

## 2024-07-02 MED ORDER — FENTANYL CITRATE (PF) 50 MCG/ML IJ SOSY
PREFILLED_SYRINGE | INTRAMUSCULAR | Status: AC
  Filled 2024-07-02: qty 2

## 2024-07-02 MED ORDER — ONDANSETRON HCL 4 MG/2ML IJ SOLN: 4.0000 mg | Freq: Once | INTRAMUSCULAR | Status: DC | PRN

## 2024-07-02 MED ORDER — BUPIVACAINE-EPINEPHRINE 0.25% -1:200000 IJ SOLN
INTRAMUSCULAR | Status: DC | PRN
  Administered 2024-07-02: 30 mL

## 2024-07-02 MED ORDER — HYDROMORPHONE HCL 1 MG/ML IJ SOLN
INTRAMUSCULAR | Status: AC
  Filled 2024-07-02: qty 1

## 2024-07-02 MED ORDER — SODIUM CHLORIDE 0.9 % IV BOLUS
1000.0000 mL | Freq: Once | INTRAVENOUS | Status: AC
  Administered 2024-07-02: 1000 mL via INTRAVENOUS

## 2024-07-02 MED ORDER — ROCURONIUM BROMIDE 10 MG/ML (PF) SYRINGE
PREFILLED_SYRINGE | INTRAVENOUS | Status: AC
  Filled 2024-07-02: qty 10

## 2024-07-02 MED ORDER — DEXAMETHASONE SOD PHOSPHATE PF 10 MG/ML IJ SOLN
INTRAMUSCULAR | Status: DC | PRN
  Administered 2024-07-02: 10 mg via INTRAVENOUS

## 2024-07-02 SURGICAL SUPPLY — 53 items
APPLICATOR COTTON TIP 6 STRL (MISCELLANEOUS) ×2 IMPLANT
BAG COUNTER SPONGE SURGICOUNT (BAG) IMPLANT
CATH FOLEY 2WAY SLVR 18FR 30CC (CATHETERS) ×2 IMPLANT
CATH ROBINSON RED A/P 16FR (CATHETERS) ×2 IMPLANT
CATH ROBINSON RED A/P 8FR (CATHETERS) ×2 IMPLANT
CATH TIEMANN FOLEY 18FR 5CC (CATHETERS) ×2 IMPLANT
CHLORAPREP W/TINT 26 (MISCELLANEOUS) ×2 IMPLANT
CLIP LIGATING HEM O LOK PURPLE (MISCELLANEOUS) ×2 IMPLANT
COVER SURGICAL LIGHT HANDLE (MISCELLANEOUS) ×2 IMPLANT
COVER TIP SHEARS 8 DVNC (MISCELLANEOUS) ×2 IMPLANT
CUTTER ECHEON FLEX ENDO 45 340 (ENDOMECHANICALS) ×2 IMPLANT
DERMABOND ADVANCED .7 DNX12 (GAUZE/BANDAGES/DRESSINGS) ×2 IMPLANT
DRAPE ARM DVNC X/XI (DISPOSABLE) ×8 IMPLANT
DRAPE COLUMN DVNC XI (DISPOSABLE) ×2 IMPLANT
DRAPE SURG IRRIG POUCH 19X23 (DRAPES) ×2 IMPLANT
DRIVER NDL LRG 8 DVNC XI (INSTRUMENTS) ×4 IMPLANT
DRIVER NDLE LRG 8 DVNC XI (INSTRUMENTS) ×4 IMPLANT
DRSG TEGADERM 4X4.75 (GAUZE/BANDAGES/DRESSINGS) ×2 IMPLANT
ELECT PENCIL ROCKER SW 15FT (MISCELLANEOUS) ×2 IMPLANT
ELECT REM PT RETURN 15FT ADLT (MISCELLANEOUS) ×2 IMPLANT
FORCEPS BPLR LNG DVNC XI (INSTRUMENTS) ×2 IMPLANT
FORCEPS PROGRASP DVNC XI (FORCEP) ×2 IMPLANT
GAUZE SPONGE 4X4 12PLY STRL (GAUZE/BANDAGES/DRESSINGS) ×2 IMPLANT
GLOVE BIO SURGEON STRL SZ 6.5 (GLOVE) ×2 IMPLANT
GLOVE SURG LX STRL 7.5 STRW (GLOVE) ×4 IMPLANT
GOWN STRL REUS W/ TWL XL LVL3 (GOWN DISPOSABLE) ×4 IMPLANT
GOWN STRL SURGICAL XL XLNG (GOWN DISPOSABLE) ×2 IMPLANT
HOLDER FOLEY CATH W/STRAP (MISCELLANEOUS) ×2 IMPLANT
IRRIGATION SUCT STRKRFLW 2 WTP (MISCELLANEOUS) ×2 IMPLANT
IV LACTATED RINGERS 1000ML (IV SOLUTION) ×2 IMPLANT
KIT TURNOVER KIT A (KITS) ×2 IMPLANT
NDL SAFETY ECLIPSE 18X1.5 (NEEDLE) IMPLANT
PACK ROBOT UROLOGY CUSTOM (CUSTOM PROCEDURE TRAY) ×2 IMPLANT
PLUG CATH AND CAP STRL 200 (CATHETERS) IMPLANT
RELOAD STAPLE 45 4.1 GRN THCK (STAPLE) ×2 IMPLANT
SCISSORS LAP 5X45 EPIX DISP (ENDOMECHANICALS) IMPLANT
SCISSORS MNPLR CVD DVNC XI (INSTRUMENTS) ×2 IMPLANT
SEAL UNIV 5-12 XI (MISCELLANEOUS) ×8 IMPLANT
SET TUBE SMOKE EVAC HIGH FLOW (TUBING) ×2 IMPLANT
SOL PREP POV-IOD 4OZ 10% (MISCELLANEOUS) ×2 IMPLANT
SOLUTION ELECTROSURG ANTI STCK (MISCELLANEOUS) ×2 IMPLANT
SPIKE FLUID TRANSFER (MISCELLANEOUS) ×2 IMPLANT
SUT ETHILON 3 0 PS 1 (SUTURE) ×2 IMPLANT
SUT MNCRL 3 0 RB1 (SUTURE) ×2 IMPLANT
SUT MNCRL 3 0 VIOLET RB1 (SUTURE) ×2 IMPLANT
SUT MNCRL AB 4-0 PS2 18 (SUTURE) ×4 IMPLANT
SUT PDS PLUS AB 0 CT-2 (SUTURE) ×4 IMPLANT
SUT VIC AB 0 CT1 27XBRD ANTBC (SUTURE) ×4 IMPLANT
SUT VIC AB 2-0 SH 27X BRD (SUTURE) ×2 IMPLANT
SUT VIC AB 3-0 SH 27X BRD (SUTURE) IMPLANT
SYR 27GX1/2 1ML LL SAFETY (SYRINGE) ×2 IMPLANT
TROCAR Z THREAD OPTICAL 12X100 (TROCAR) IMPLANT
WATER STERILE IRR 1000ML POUR (IV SOLUTION) ×2 IMPLANT

## 2024-07-02 NOTE — Op Note (Signed)
 Preoperative diagnosis: Clinically localized adenocarcinoma of the prostate (clinical stage T1c N0 M0)  Postoperative diagnosis: Clinically localized adenocarcinoma of the prostate (clinical stage T1c N0 M0)  Procedure:  Robotic assisted laparoscopic radical prostatectomy (bilateral nerve sparing) Bilateral robotic assisted laparoscopic pelvic lymphadenectomy  Surgeon: Gretel CANDIE Renda Mickey. M.D.  Assistant: Alan Hammonds, PA-C   An assistant was required for this surgical procedure.  The duties of the assistant included but were not limited to suctioning, passing suture, camera manipulation, retraction. This procedure would not be able to be performed without an geophysicist/field seismologist.  Anesthesia: General  Complications: None  EBL: 75 mL  IVF:  1500 mL crystalloid  Specimens: Prostate and seminal vesicles Right pelvic lymph nodes Left pelvic lymph nodes  Disposition of specimens: Pathology  Drains: 20 Fr coude catheter # 19 Blake pelvic drain  Indication: Dwayne Cross is a 73 y.o. year old patient with clinically localized prostate cancer.  After a thorough review of the management options for treatment of prostate cancer, he elected to proceed with surgical therapy and the above procedure(s).  We have discussed the potential benefits and risks of the procedure, side effects of the proposed treatment, the likelihood of the patient achieving the goals of the procedure, and any potential problems that might occur during the procedure or recuperation. Informed consent has been obtained.  Description of procedure:  The patient was taken to the operating room and a general anesthetic was administered. He was given preoperative antibiotics, placed in the dorsal lithotomy position, and prepped and draped in the usual sterile fashion. Next a preoperative timeout was performed. A urethral catheter was placed into the bladder and a site was selected near the umbilicus for placement of the camera  port. This was placed using a standard open Hassan technique which allowed entry into the peritoneal cavity under direct vision and without difficulty. An 8 mm robotic port was placed and a pneumoperitoneum established. The camera was then used to inspect the abdomen and there was no evidence of any intra-abdominal injuries or other abnormalities. The remaining abdominal ports were then placed. 8 mm robotic ports were placed in the right lower quadrant, left lower quadrant, and far left lateral abdominal wall. A 5 mm port was placed in the right upper quadrant and a 12 mm port was placed in the right lateral abdominal wall for laparoscopic assistance. All ports were placed under direct vision without difficulty. The surgical cart was then docked.   Utilizing the cautery scissors, the bladder was reflected posteriorly allowing entry into the space of Retzius and identification of the endopelvic fascia and prostate. The periprostatic fat was then removed from the prostate allowing full exposure of the endopelvic fascia. The endopelvic fascia was then incised from the apex back to the base of the prostate bilaterally and the underlying levator muscle fibers were swept laterally off the prostate thereby isolating the dorsal venous complex. The dorsal vein was then stapled and divided with a 45 mm Flex Echelon stapler. Attention then turned to the bladder neck which was divided anteriorly thereby allowing entry into the bladder and exposure of the urethral catheter. The catheter balloon was deflated and the catheter was brought into the operative field and used to retract the prostate anteriorly. The posterior bladder neck was then examined and was divided allowing further dissection between the bladder and prostate posteriorly until the vasa deferentia and seminal vessels were identified. The vasa deferentia were isolated, divided, and lifted anteriorly. The seminal vesicles were dissected down  to their tips with care  to control the seminal vascular arterial blood supply. These structures were then lifted anteriorly and the space between Denonvillier's fascia and the anterior rectum was developed with a combination of sharp and blunt dissection. This isolated the vascular pedicles of the prostate.  The lateral prostatic fascia was then sharply incised allowing release of the neurovascular bundles bilaterally. The vascular pedicles of the prostate were then ligated with Weck clips between the prostate and neurovascular bundles and divided with sharp cold scissor dissection resulting in neurovascular bundle preservation. The neurovascular bundles were then separated off the apex of the prostate and urethra bilaterally.  The urethra was then sharply transected allowing the prostate specimen to be disarticulated. The pelvis was copiously irrigated and hemostasis was ensured. There was no evidence for rectal injury.  Attention then turned to the right pelvic sidewall. The fibrofatty tissue between the external iliac vein, confluence of the iliac vessels, hypogastric artery, and Cooper's ligament was dissected free from the pelvic sidewall with care to preserve the obturator nerve. Weck clips were used for lymphostasis and hemostasis. An identical procedure was performed on the contralateral side and the lymphatic packets were removed for permanent pathologic analysis.  The left sided lymphatic packet was quite stuck but was able to be removed along the above parameters.  Attention then turned to the urethral anastomosis. A 2-0 Vicryl slip knot was placed between Denonvillier's fascia, the posterior bladder neck, and the posterior urethra to reapproximate these structures. A double-armed 3-0 Monocryl suture was then used to perform a 360 running tension-free anastomosis between the bladder neck and urethra. A new urethral catheter was then placed into the bladder and irrigated. There were no blood clots within the bladder and  the anastomosis appeared to be watertight. A #19 Blake drain was then brought through the left lateral 8 mm port site and positioned appropriately within the pelvis. It was secured to the skin with a nylon suture. The surgical cart was then undocked. The right lateral 12 mm port site was closed at the fascial level with a 0 Vicryl suture placed laparoscopically. All remaining ports were then removed under direct vision. The prostate specimen was removed intact within the Endopouch retrieval bag via the periumbilical camera port site. This fascial opening was closed with two running 0 PDS sutures. 0.25% Marcaine  was then injected into all port sites and all incisions were reapproximated at the skin level with 4-0 Monocryl subcuticular sutures and Dermabond. The patient appeared to tolerate the procedure well and without complications. The patient was able to be extubated and transferred to the recovery unit in satisfactory condition.   Gretel CANDIE Renda Teddie MD

## 2024-07-02 NOTE — Transfer of Care (Signed)
 Immediate Anesthesia Transfer of Care Note  Patient: Dwayne Cross  Procedure(s) Performed: PROSTATECTOMY, RADICAL, ROBOT-ASSISTED, LAPAROSCOPIC LYMPHADENECTOMY, PELVIS, ROBOT-ASSISTED (Bilateral)  Patient Location: PACU  Anesthesia Type:General  Level of Consciousness: drowsy  Airway & Oxygen Therapy: Patient Spontanous Breathing and Patient connected to face mask oxygen  Post-op Assessment: Report given to RN and Post -op Vital signs reviewed and stable  Post vital signs: Reviewed and stable  Last Vitals:  Vitals Value Taken Time  BP 157/91 07/02/24 12:53  Temp    Pulse 73 07/02/24 12:55  Resp 11 07/02/24 12:55  SpO2 100 % 07/02/24 12:55  Vitals shown include unfiled device data.  Last Pain:  Vitals:   07/02/24 0945  TempSrc: Oral         Complications: No notable events documented.

## 2024-07-02 NOTE — Anesthesia Procedure Notes (Signed)
 Procedure Name: Intubation Date/Time: 07/02/2024 10:31 AM  Performed by: Therisa Doyal CROME, CRNAPre-anesthesia Checklist: Patient identified, Emergency Drugs available, Suction available and Patient being monitored Patient Re-evaluated:Patient Re-evaluated prior to induction Oxygen Delivery Method: Circle system utilized Preoxygenation: Pre-oxygenation with 100% oxygen Induction Type: IV induction Ventilation: Mask ventilation without difficulty and Oral airway inserted - appropriate to patient size Laryngoscope Size: Miller and 3 Grade View: Grade I Tube type: Oral Tube size: 8.0 mm Number of attempts: 1 Airway Equipment and Method: Stylet Placement Confirmation: ETT inserted through vocal cords under direct vision, positive ETCO2 and breath sounds checked- equal and bilateral Secured at: 22 cm Tube secured with: Tape Dental Injury: Teeth and Oropharynx as per pre-operative assessment

## 2024-07-02 NOTE — Discharge Instructions (Signed)

## 2024-07-02 NOTE — Interval H&P Note (Signed)
 History and Physical Interval Note:  07/02/2024 9:21 AM  Dwayne Cross  has presented today for surgery, with the diagnosis of PROSTATE CANCER.  The various methods of treatment have been discussed with the patient and family. After consideration of risks, benefits and other options for treatment, the patient has consented to  Procedure(s): PROSTATECTOMY, RADICAL, ROBOT-ASSISTED, LAPAROSCOPIC (N/A) LYMPHADENECTOMY, PELVIS, ROBOT-ASSISTED (Bilateral) as a surgical intervention.  The patient's history has been reviewed, patient examined, no change in status, stable for surgery.  I have reviewed the patient's chart and labs.  Questions were answered to the patient's satisfaction.     [employer]

## 2024-07-02 NOTE — Anesthesia Preprocedure Evaluation (Signed)
 Anesthesia Evaluation  Patient identified by MRN, date of birth, ID band Patient awake    Reviewed: Allergy & Precautions, NPO status , Patient's Chart, lab work & pertinent test results, reviewed documented beta blocker date and time   History of Anesthesia Complications Negative for: history of anesthetic complications  Airway Mallampati: III  TM Distance: >3 FB     Dental no notable dental hx.    Pulmonary neg COPD   breath sounds clear to auscultation       Cardiovascular hypertension, (-) angina (-) CAD and (-) Past MI  Rhythm:Regular Rate:Normal     Neuro/Psych neg Seizures PSYCHIATRIC DISORDERS  Depression       GI/Hepatic ,neg GERD  ,,(+) neg Cirrhosis        Endo/Other    Renal/GU Renal disease     Musculoskeletal   Abdominal   Peds  Hematology   Anesthesia Other Findings   Reproductive/Obstetrics                              Anesthesia Physical Anesthesia Plan  ASA: 2  Anesthesia Plan: General   Post-op Pain Management:    Induction: Intravenous  PONV Risk Score and Plan: 2 and Ondansetron  and Dexamethasone   Airway Management Planned: Oral ETT  Additional Equipment:   Intra-op Plan:   Post-operative Plan: Extubation in OR  Informed Consent: I have reviewed the patients History and Physical, chart, labs and discussed the procedure including the risks, benefits and alternatives for the proposed anesthesia with the patient or authorized representative who has indicated his/her understanding and acceptance.     Dental advisory given  Plan Discussed with: CRNA  Anesthesia Plan Comments:         Anesthesia Quick Evaluation

## 2024-07-02 NOTE — Progress Notes (Signed)
 Patient ID: Dwayne Cross, male   DOB: 19-Nov-1950, 73 y.o.   MRN: [id number]  Post-op note  Subjective: The patient is doing well.  No complaints.  Objective: Vital signs in last 24 hours: Temp:  [97.1 F (36.2 C)-98.5 F (36.9 C)] 98.2 F (36.8 C) (11/06 1507) Pulse Rate:  [65-81] 81 (11/06 1507) Resp:  [10-18] 14 (11/06 1507) BP: (139-168)/(89-100) 155/89 (11/06 1507) SpO2:  [96 %-100 %] 100 % (11/06 1507) Weight:  [83 kg] 83 kg (11/06 0945)  Intake/Output from previous day: No intake/output data recorded. Intake/Output this shift: Total I/O In: 2400 [I.V.:1300; IV Piggyback:1100] Out: 75 [Blood:75]  Physical Exam:  General: Alert and oriented. Abdomen: Soft, Nondistended. Incisions: Clean and dry.  Lab Results: Recent Labs    07/02/24 1322  HGB 15.4  HCT 46.0    Assessment/Plan: POD#0   1) Continue to monitor, ambulate, IS   Gretel CANDIE Renda Mickey. MD   LOS: 0 days   Noretta Renda 07/02/2024, 5:25 PM

## 2024-07-03 ENCOUNTER — Encounter (HOSPITAL_COMMUNITY): Payer: Self-pay | Admitting: Urology

## 2024-07-03 DIAGNOSIS — C61 Malignant neoplasm of prostate: Secondary | ICD-10-CM | POA: Diagnosis not present

## 2024-07-03 DIAGNOSIS — I10 Essential (primary) hypertension: Secondary | ICD-10-CM | POA: Diagnosis not present

## 2024-07-03 LAB — HEMOGLOBIN AND HEMATOCRIT, BLOOD
HCT: 39.7 % (ref 39.0–52.0)
Hemoglobin: 13.6 g/dL (ref 13.0–17.0)

## 2024-07-03 MED ORDER — HYDROMORPHONE HCL 2 MG PO TABS: 2.0000 mg | ORAL_TABLET | ORAL | Status: DC | PRN

## 2024-07-03 MED ORDER — TRAMADOL HCL 50 MG PO TABS: 50.0000 mg | ORAL_TABLET | Freq: Four times a day (QID) | ORAL | Status: DC | PRN

## 2024-07-03 MED ORDER — BISACODYL 10 MG RE SUPP
10.0000 mg | Freq: Once | RECTAL | Status: AC
  Administered 2024-07-03: 10 mg via RECTAL
  Filled 2024-07-03: qty 1

## 2024-07-03 NOTE — Progress Notes (Signed)
 Patient to be discharged to home today. All discharge instructions/teaching including all discharge Medications and schedules for these Medications reviewed with the Patient and Patient's Wife. Home Post op Prostatectomy care foley and leg bag teaching done along with all home instructions reviewed with the Patient and Patient's Wife. Understanding verbalized and discharge AVS with the Patient at time of discharge

## 2024-07-03 NOTE — Progress Notes (Signed)
   07/03/24 0940  TOC Brief Assessment  Insurance and Status Reviewed  Patient has primary care physician Yes  Home environment has been reviewed Resides in single family home with spouse  Prior level of function: Independent with ADLs at baseline  Prior/Current Home Services No current home services  Social Drivers of Health Review SDOH reviewed no interventions necessary  Readmission risk has been reviewed Yes  Transition of care needs no transition of care needs at this time

## 2024-07-03 NOTE — Anesthesia Postprocedure Evaluation (Signed)
 Anesthesia Post Note  Patient: Dwayne Cross  Procedure(s) Performed: PROSTATECTOMY, RADICAL, ROBOT-ASSISTED, LAPAROSCOPIC LYMPHADENECTOMY, PELVIS, ROBOT-ASSISTED (Bilateral)     Patient location during evaluation: PACU Anesthesia Type: General Level of consciousness: awake and alert Pain management: pain level controlled Vital Signs Assessment: post-procedure vital signs reviewed and stable Respiratory status: spontaneous breathing, nonlabored ventilation, respiratory function stable and patient connected to nasal cannula oxygen Cardiovascular status: blood pressure returned to baseline and stable Postop Assessment: no apparent nausea or vomiting Anesthetic complications: no   No notable events documented.  Last Vitals:  Vitals:   07/02/24 2259 07/03/24 0502  BP: 103/73 116/71  Pulse: 82 66  Resp: 15 17  Temp: 36.8 C 37 C  SpO2: 93% 97%    Last Pain:  Vitals:   07/03/24 0509  TempSrc:   PainSc: 5                  Lynwood MARLA Cornea

## 2024-07-03 NOTE — Discharge Summary (Signed)
 Date of admission: 07/02/2024  Date of discharge: 07/03/2024  Admission diagnosis: Prostate Cancer  Discharge diagnosis: Prostate Cancer  History and Physical: For full details, please see admission history and physical. Briefly, Dwayne Cross is a 73 y.o. gentleman with localized prostate cancer.  After discussing management/treatment options, he elected to proceed with surgical treatment.  Hospital Course: HUGHIE MELROY was taken to the operating room on 07/02/2024 and underwent a robotic assisted laparoscopic radical prostatectomy. He tolerated this procedure well and without complications. Postoperatively, he was able to be transferred to a regular hospital room following recovery from anesthesia.  He was able to begin ambulating the night of surgery. He remained hemodynamically stable overnight.  He had excellent urine output with appropriately minimal output from his pelvic drain and his pelvic drain was removed on POD #1.  He was transitioned to oral pain medication, tolerated a clear liquid diet, and had met all discharge criteria and was able to be discharged home later on POD#1.  Laboratory values:  Recent Labs    07/02/24 1322 07/03/24 0459  HGB 15.4 13.6  HCT 46.0 39.7    Disposition: Home  Discharge instruction: He was instructed to be ambulatory but to refrain from heavy lifting, strenuous activity, or driving. He was instructed on urethral catheter care.  Discharge medications:   Allergies as of 07/03/2024       Reactions   Morphine And Codeine Other (See Comments)   Anxious, figity        Medication List     STOP taking these medications    alfuzosin 10 MG 24 hr tablet Commonly known as: UROXATRAL   ibuprofen 200 MG tablet Commonly known as: ADVIL   multivitamin with minerals Tabs tablet   Vitamin D3 125 MCG (5000 UT) Tabs       TAKE these medications    acetaminophen  500 MG tablet Commonly known as: TYLENOL  Take 2 tablets (1,000 mg total) by  mouth every 8 (eight) hours. What changed:  when to take this reasons to take this   buPROPion 300 MG 24 hr tablet Commonly known as: WELLBUTRIN XL Take 300 mg by mouth in the morning.   docusate sodium 100 MG capsule Commonly known as: COLACE Take 1 capsule (100 mg total) by mouth 2 (two) times daily.   fluticasone 50 MCG/ACT nasal spray Commonly known as: FLONASE Place 1 spray into both nostrils daily as needed for allergies.   lidocaine  5 % Commonly known as: LIDODERM  Place 1 patch onto the skin daily. Remove & Discard patch within 12 hours or as directed by MD What changed:  when to take this reasons to take this   sulfamethoxazole-trimethoprim 800-160 MG tablet Commonly known as: BACTRIM DS Take 1 tablet by mouth 2 (two) times daily. Start the day prior to foley removal appointment   valsartan-hydrochlorothiazide 160-12.5 MG tablet Commonly known as: DIOVAN-HCT Take 1 tablet by mouth in the morning.        Followup: He will followup in 1 week for catheter removal and to discuss his surgical pathology results.

## 2024-07-03 NOTE — Progress Notes (Signed)
 Patient ID: Dwayne Cross, male   DOB: [birth year], 73 y.o.   MRN: [id number]  1 Day Post-Op Subjective: The patient is doing well.  No nausea or vomiting. Pain is adequately controlled.  Objective: Vital signs in last 24 hours: Temp:  [97.1 F (36.2 C)-98.6 F (37 C)] 98.6 F (37 C) (11/07 0502) Pulse Rate:  [65-83] 66 (11/07 0502) Resp:  [10-18] 17 (11/07 0502) BP: (103-168)/(71-100) 116/71 (11/07 0502) SpO2:  [92 %-100 %] 97 % (11/07 0502) Weight:  [83 kg] 83 kg (11/06 0945)  Intake/Output from previous day: 11/06 0701 - 11/07 0700 In: 3553.9 [P.O.:100; I.V.:2303.9; IV Piggyback:1150] Out: 1495 [Urine:1350; Drains:70; Blood:75] Intake/Output this shift: No intake/output data recorded.  Physical Exam:  General: Alert and oriented. CV: RRR Lungs: Clear bilaterally. GI: Soft, Nondistended. Incisions: Clean, dry, and intact Urine: Clear Extremities: Nontender, no erythema, no edema.  Lab Results: Recent Labs    07/02/24 1322 07/03/24 0459  HGB 15.4 13.6  HCT 46.0 39.7      Assessment/Plan: POD# 1 s/p robotic prostatectomy.  1) SL IVF 2) Ambulate, Incentive spirometry 3) Transition to oral pain medication 4) Dulcolax suppository 5) D/C pelvic drain 6) Plan for likely discharge later today   Dwayne CANDIE Cross Mickey. MD   LOS: 0 days   Dwayne Cross 07/03/2024, 8:00 AM

## 2024-07-07 LAB — SURGICAL PATHOLOGY

## 2024-07-08 DIAGNOSIS — N5201 Erectile dysfunction due to arterial insufficiency: Secondary | ICD-10-CM | POA: Diagnosis not present

## 2024-07-08 DIAGNOSIS — C61 Malignant neoplasm of prostate: Secondary | ICD-10-CM | POA: Diagnosis not present

## 2024-07-09 DIAGNOSIS — M47814 Spondylosis without myelopathy or radiculopathy, thoracic region: Secondary | ICD-10-CM | POA: Diagnosis not present

## 2024-07-09 DIAGNOSIS — C61 Malignant neoplasm of prostate: Secondary | ICD-10-CM | POA: Diagnosis not present

## 2024-07-09 DIAGNOSIS — M48061 Spinal stenosis, lumbar region without neurogenic claudication: Secondary | ICD-10-CM | POA: Diagnosis not present

## 2024-07-09 DIAGNOSIS — F32A Depression, unspecified: Secondary | ICD-10-CM | POA: Diagnosis not present

## 2024-07-09 DIAGNOSIS — K5903 Drug induced constipation: Secondary | ICD-10-CM | POA: Diagnosis not present

## 2024-07-09 DIAGNOSIS — M47816 Spondylosis without myelopathy or radiculopathy, lumbar region: Secondary | ICD-10-CM | POA: Diagnosis not present

## 2024-07-09 DIAGNOSIS — W19XXXD Unspecified fall, subsequent encounter: Secondary | ICD-10-CM | POA: Diagnosis not present

## 2024-07-09 DIAGNOSIS — I10 Essential (primary) hypertension: Secondary | ICD-10-CM | POA: Diagnosis not present

## 2024-07-09 DIAGNOSIS — S32018D Other fracture of first lumbar vertebra, subsequent encounter for fracture with routine healing: Secondary | ICD-10-CM | POA: Diagnosis not present

## 2024-07-09 DIAGNOSIS — T402X5D Adverse effect of other opioids, subsequent encounter: Secondary | ICD-10-CM | POA: Diagnosis not present

## 2024-07-09 DIAGNOSIS — E041 Nontoxic single thyroid nodule: Secondary | ICD-10-CM | POA: Diagnosis not present

## 2024-07-09 DIAGNOSIS — E785 Hyperlipidemia, unspecified: Secondary | ICD-10-CM | POA: Diagnosis not present

## 2024-07-15 DIAGNOSIS — S32010D Wedge compression fracture of first lumbar vertebra, subsequent encounter for fracture with routine healing: Secondary | ICD-10-CM | POA: Diagnosis not present

## 2024-07-15 DIAGNOSIS — S32010A Wedge compression fracture of first lumbar vertebra, initial encounter for closed fracture: Secondary | ICD-10-CM | POA: Diagnosis not present

## 2024-07-15 DIAGNOSIS — Z6826 Body mass index (BMI) 26.0-26.9, adult: Secondary | ICD-10-CM | POA: Diagnosis not present

## 2024-07-24 DIAGNOSIS — S32018D Other fracture of first lumbar vertebra, subsequent encounter for fracture with routine healing: Secondary | ICD-10-CM | POA: Diagnosis not present

## 2024-07-24 DIAGNOSIS — C61 Malignant neoplasm of prostate: Secondary | ICD-10-CM | POA: Diagnosis not present

## 2024-07-24 DIAGNOSIS — M48061 Spinal stenosis, lumbar region without neurogenic claudication: Secondary | ICD-10-CM | POA: Diagnosis not present

## 2024-07-24 DIAGNOSIS — M47816 Spondylosis without myelopathy or radiculopathy, lumbar region: Secondary | ICD-10-CM | POA: Diagnosis not present

## 2024-07-24 DIAGNOSIS — M47814 Spondylosis without myelopathy or radiculopathy, thoracic region: Secondary | ICD-10-CM | POA: Diagnosis not present

## 2024-07-24 DIAGNOSIS — E785 Hyperlipidemia, unspecified: Secondary | ICD-10-CM | POA: Diagnosis not present

## 2024-07-24 DIAGNOSIS — F32A Depression, unspecified: Secondary | ICD-10-CM | POA: Diagnosis not present

## 2024-07-24 DIAGNOSIS — K5903 Drug induced constipation: Secondary | ICD-10-CM | POA: Diagnosis not present

## 2024-07-24 DIAGNOSIS — T402X5D Adverse effect of other opioids, subsequent encounter: Secondary | ICD-10-CM | POA: Diagnosis not present

## 2024-07-24 DIAGNOSIS — I10 Essential (primary) hypertension: Secondary | ICD-10-CM | POA: Diagnosis not present

## 2024-07-24 DIAGNOSIS — W19XXXD Unspecified fall, subsequent encounter: Secondary | ICD-10-CM | POA: Diagnosis not present

## 2024-07-24 DIAGNOSIS — E041 Nontoxic single thyroid nodule: Secondary | ICD-10-CM | POA: Diagnosis not present

## 2024-07-27 DIAGNOSIS — S32018D Other fracture of first lumbar vertebra, subsequent encounter for fracture with routine healing: Secondary | ICD-10-CM | POA: Diagnosis not present

## 2024-07-27 DIAGNOSIS — E785 Hyperlipidemia, unspecified: Secondary | ICD-10-CM | POA: Diagnosis not present

## 2024-07-27 DIAGNOSIS — W19XXXD Unspecified fall, subsequent encounter: Secondary | ICD-10-CM | POA: Diagnosis not present

## 2024-07-27 DIAGNOSIS — M48061 Spinal stenosis, lumbar region without neurogenic claudication: Secondary | ICD-10-CM | POA: Diagnosis not present

## 2024-07-27 DIAGNOSIS — K5903 Drug induced constipation: Secondary | ICD-10-CM | POA: Diagnosis not present

## 2024-07-27 DIAGNOSIS — M47814 Spondylosis without myelopathy or radiculopathy, thoracic region: Secondary | ICD-10-CM | POA: Diagnosis not present

## 2024-07-27 DIAGNOSIS — I10 Essential (primary) hypertension: Secondary | ICD-10-CM | POA: Diagnosis not present

## 2024-07-27 DIAGNOSIS — E041 Nontoxic single thyroid nodule: Secondary | ICD-10-CM | POA: Diagnosis not present

## 2024-07-27 DIAGNOSIS — F32A Depression, unspecified: Secondary | ICD-10-CM | POA: Diagnosis not present

## 2024-07-27 DIAGNOSIS — M47816 Spondylosis without myelopathy or radiculopathy, lumbar region: Secondary | ICD-10-CM | POA: Diagnosis not present

## 2024-07-27 DIAGNOSIS — T402X5D Adverse effect of other opioids, subsequent encounter: Secondary | ICD-10-CM | POA: Diagnosis not present

## 2024-07-27 DIAGNOSIS — C61 Malignant neoplasm of prostate: Secondary | ICD-10-CM | POA: Diagnosis not present

## 2024-07-29 DIAGNOSIS — J069 Acute upper respiratory infection, unspecified: Secondary | ICD-10-CM | POA: Diagnosis not present

## 2024-07-30 DIAGNOSIS — M6281 Muscle weakness (generalized): Secondary | ICD-10-CM | POA: Diagnosis not present

## 2024-07-30 DIAGNOSIS — M62838 Other muscle spasm: Secondary | ICD-10-CM | POA: Diagnosis not present

## 2024-07-30 DIAGNOSIS — N393 Stress incontinence (female) (male): Secondary | ICD-10-CM | POA: Diagnosis not present

## 2024-08-10 DIAGNOSIS — J40 Bronchitis, not specified as acute or chronic: Secondary | ICD-10-CM | POA: Diagnosis not present

## 2024-08-10 DIAGNOSIS — R0981 Nasal congestion: Secondary | ICD-10-CM | POA: Diagnosis not present

## 2024-08-10 DIAGNOSIS — R051 Acute cough: Secondary | ICD-10-CM | POA: Diagnosis not present

## 2024-08-10 DIAGNOSIS — R062 Wheezing: Secondary | ICD-10-CM | POA: Diagnosis not present

## 2024-10-07 ENCOUNTER — Ambulatory Visit
